# Patient Record
Sex: Male | Born: 1975 | Race: White | Hispanic: No | Smoking: Never smoker
Health system: Southern US, Community
[De-identification: ages and names within clinical notes are randomized; demographics above are authoritative.]

## PROBLEM LIST (undated history)

## (undated) DIAGNOSIS — N200 Calculus of kidney: Secondary | ICD-10-CM

## (undated) HISTORY — PX: TONSILLECTOMY: SUR1361

## (undated) HISTORY — PX: KNEE ARTHROSCOPY: SUR90

## (undated) HISTORY — PX: SHOULDER ARTHROSCOPY W/ ROTATOR CUFF REPAIR: SHX2400

## (undated) HISTORY — DX: Calculus of kidney: N20.0

## (undated) HISTORY — PX: HERNIA REPAIR: SHX51

---

## 2008-02-06 ENCOUNTER — Emergency Department (HOSPITAL_COMMUNITY): Admission: EM | Admit: 2008-02-06 | Discharge: 2008-02-07 | Payer: Self-pay | Admitting: Emergency Medicine

## 2009-05-08 ENCOUNTER — Ambulatory Visit (HOSPITAL_COMMUNITY): Admission: RE | Admit: 2009-05-08 | Discharge: 2009-05-08 | Payer: Self-pay | Admitting: Orthopedic Surgery

## 2010-03-04 IMAGING — CR DG HAND COMPLETE 3+V*R*
3 series · 3 of 3 positions shown · non-contrast
Comparison: None

CLINICAL DATA: Dog bite to right hand

RIGHT HAND - COMPLETE 3+ VIEW

[x hand pa right]
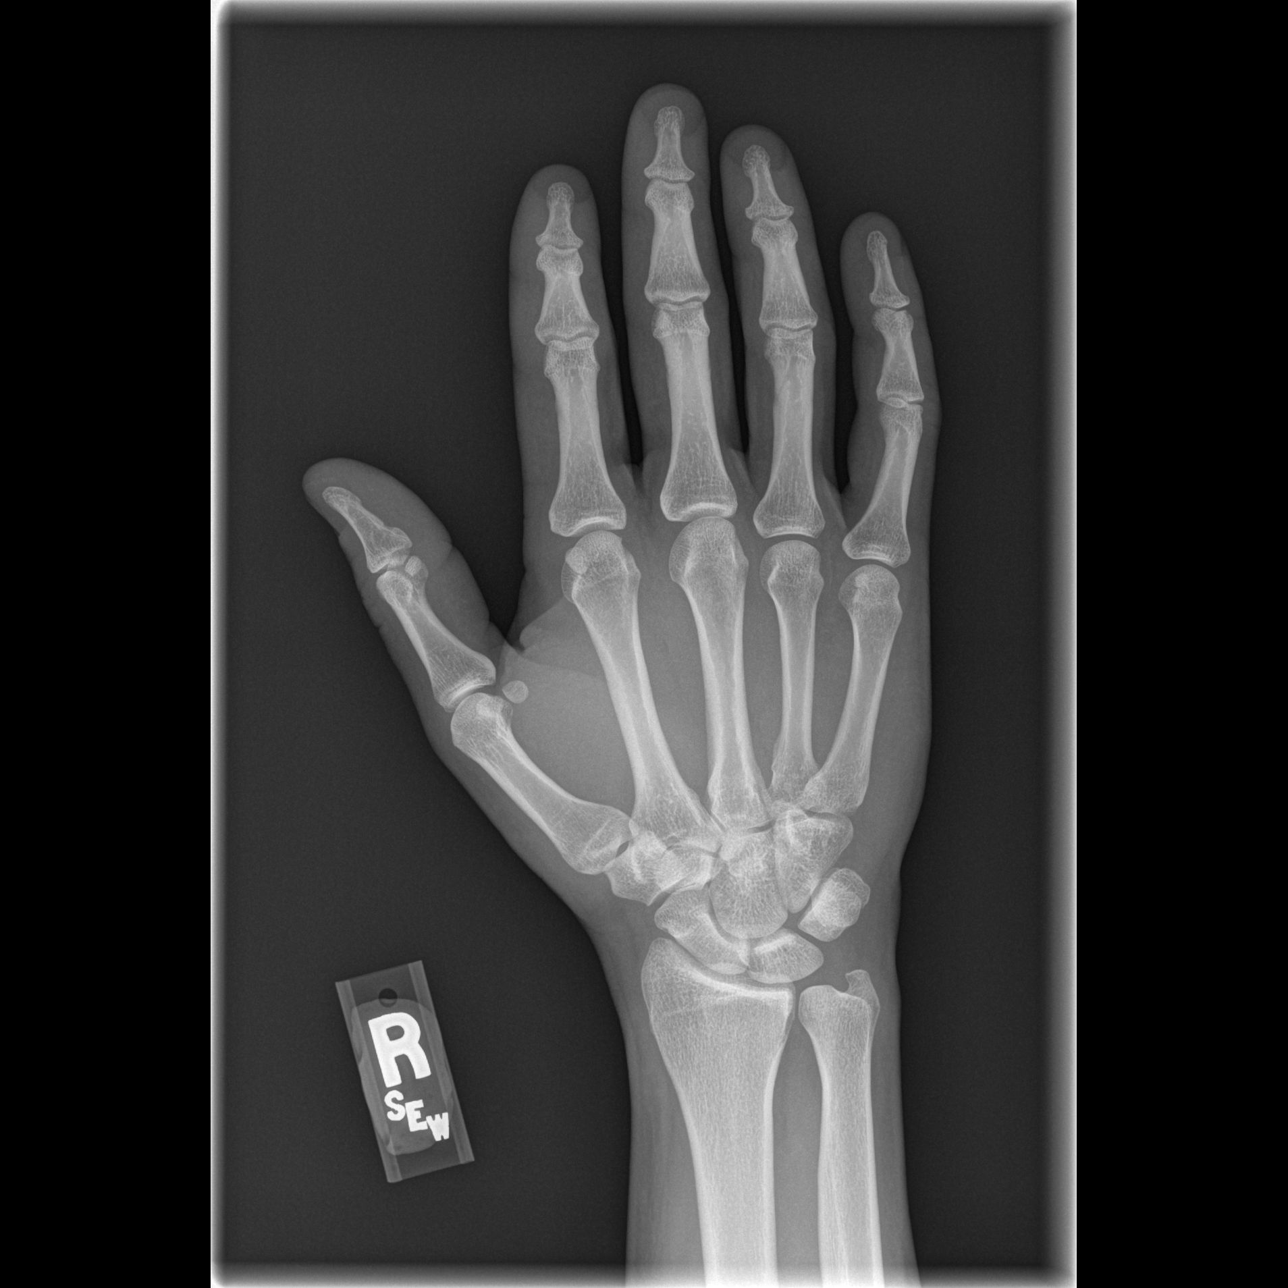

[x hand oblique right]
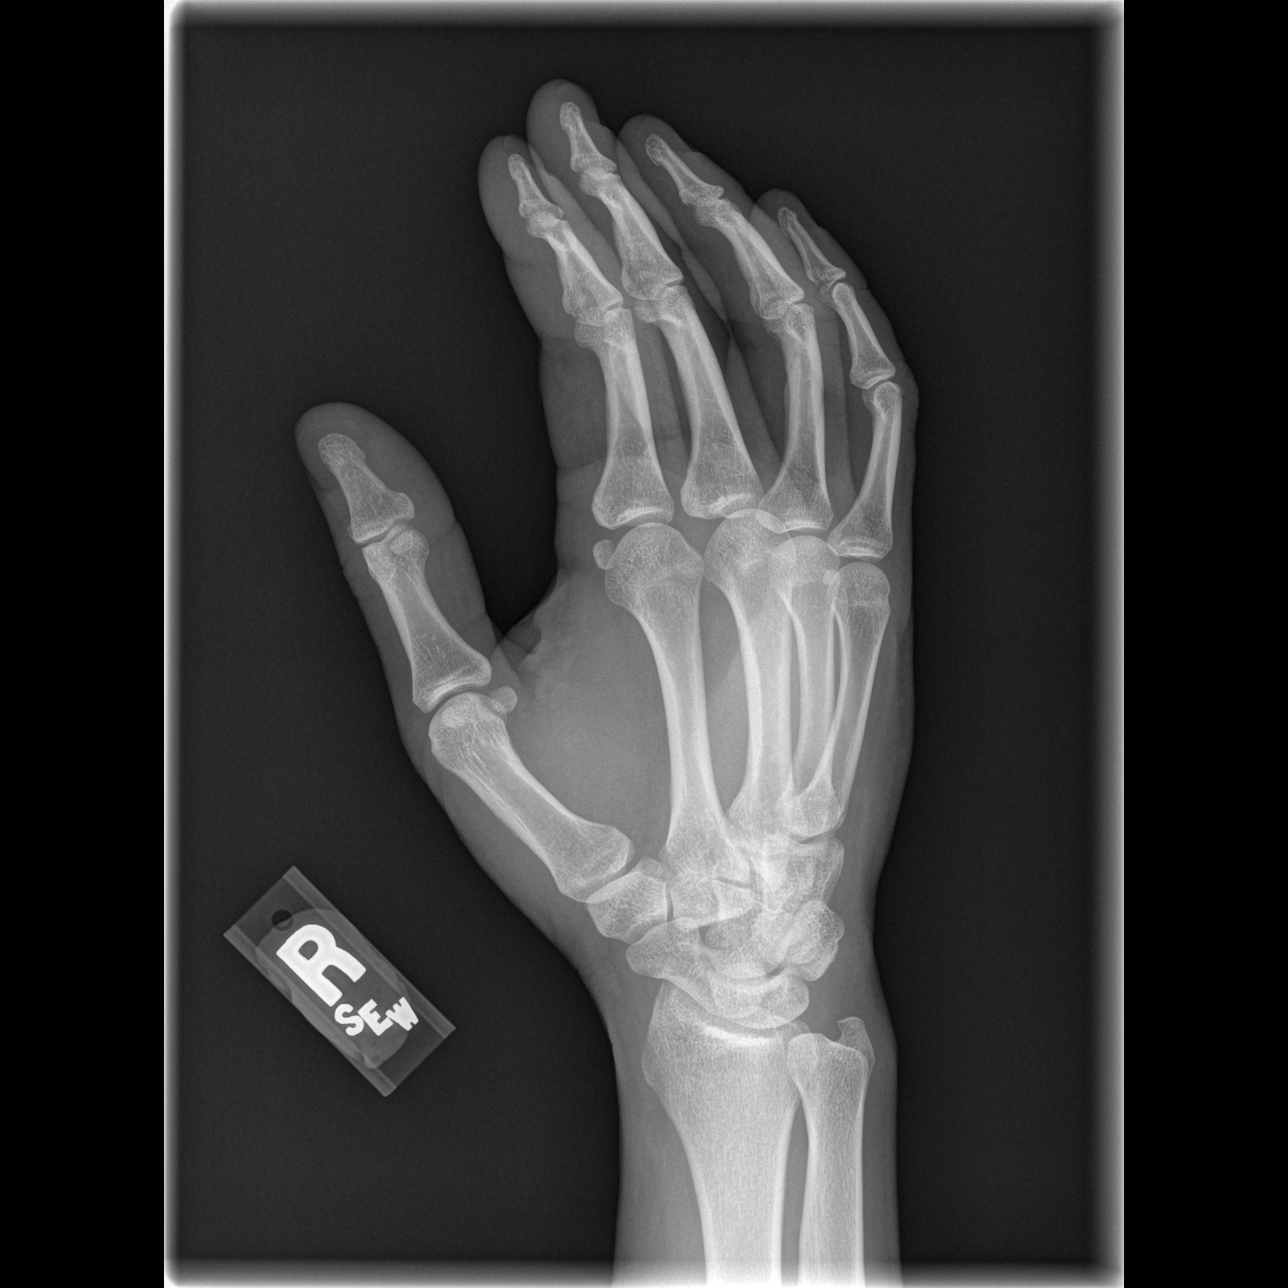

[x hand lat right]
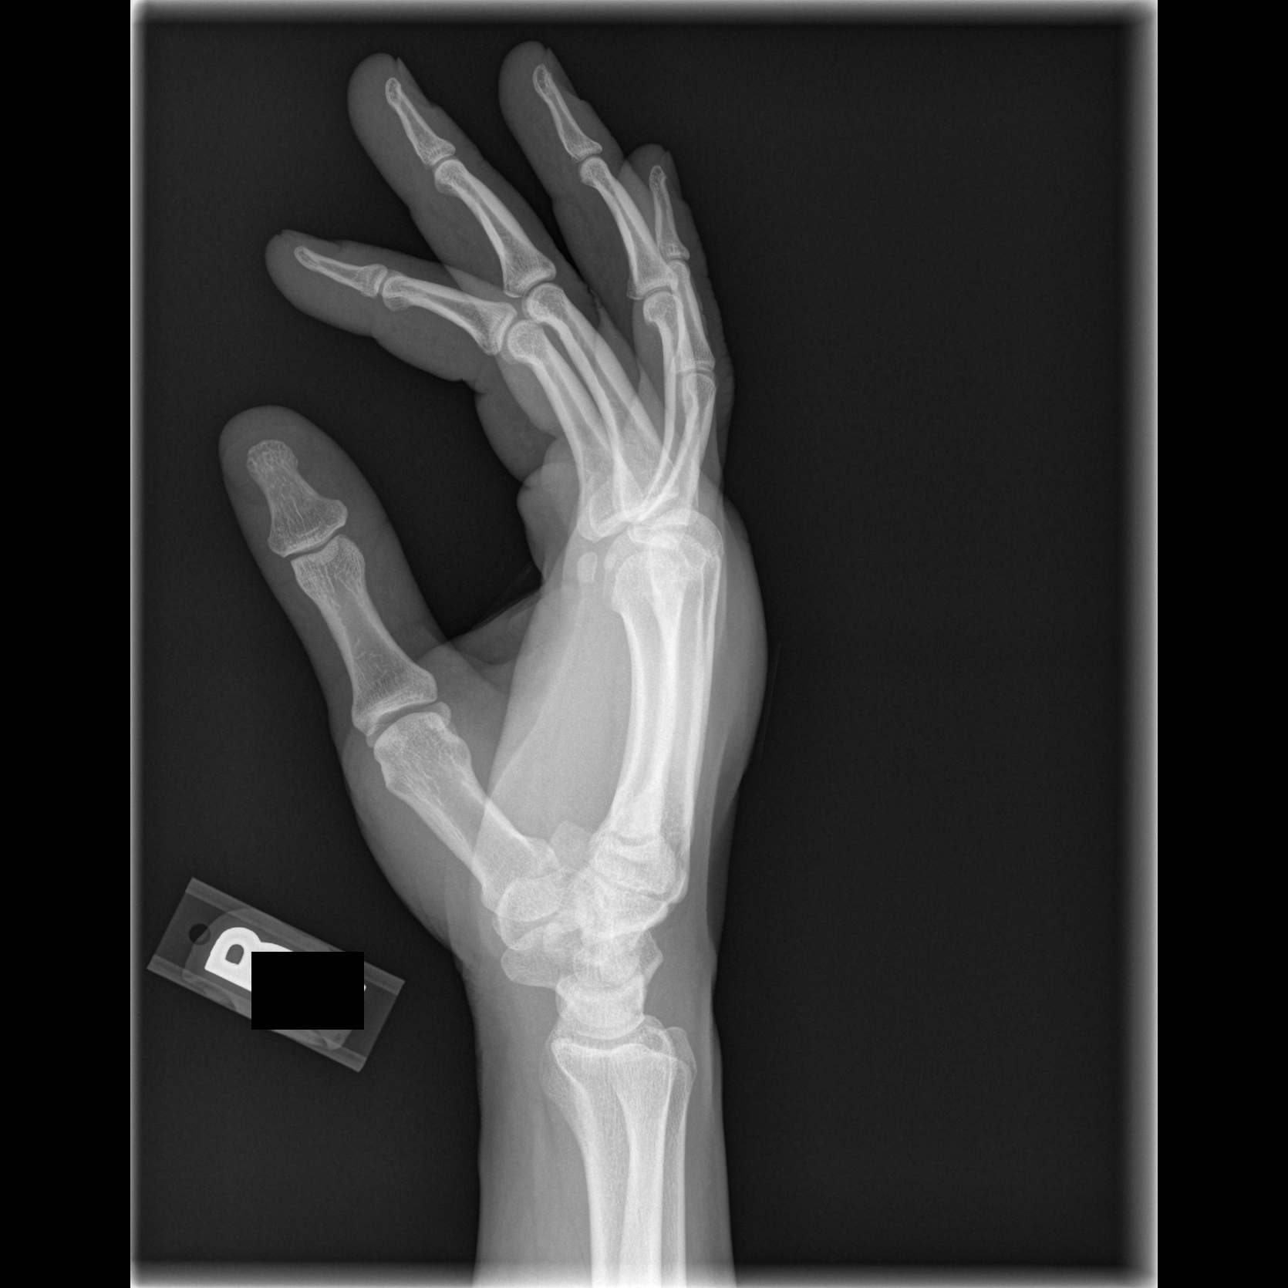

[3 of 3 positions shown; findings below may reference images not displayed]

FINDINGS: Dorsal soft tissue swelling is present.
No evidence of fracture, subluxation, or dislocation identified.
No evidence of radiopaque body noted.
IMPRESSION: Soft tissue swelling without evidence of acute bony abnormality or
radiopaque foreign body.

## 2015-11-28 DIAGNOSIS — I10 Essential (primary) hypertension: Secondary | ICD-10-CM | POA: Insufficient documentation

## 2016-12-18 DIAGNOSIS — Z86010 Personal history of colonic polyps: Secondary | ICD-10-CM | POA: Insufficient documentation

## 2018-04-07 DIAGNOSIS — M2550 Pain in unspecified joint: Secondary | ICD-10-CM | POA: Insufficient documentation

## 2018-04-09 DIAGNOSIS — R768 Other specified abnormal immunological findings in serum: Secondary | ICD-10-CM | POA: Insufficient documentation

## 2018-04-09 DIAGNOSIS — R7689 Other specified abnormal immunological findings in serum: Secondary | ICD-10-CM | POA: Insufficient documentation

## 2019-03-22 DIAGNOSIS — N200 Calculus of kidney: Secondary | ICD-10-CM | POA: Diagnosis not present

## 2019-03-23 DIAGNOSIS — N3000 Acute cystitis without hematuria: Secondary | ICD-10-CM | POA: Diagnosis not present

## 2019-03-23 DIAGNOSIS — M545 Low back pain: Secondary | ICD-10-CM | POA: Diagnosis not present

## 2020-04-15 DIAGNOSIS — Z20828 Contact with and (suspected) exposure to other viral communicable diseases: Secondary | ICD-10-CM | POA: Diagnosis not present

## 2020-04-15 DIAGNOSIS — R509 Fever, unspecified: Secondary | ICD-10-CM | POA: Diagnosis not present

## 2020-04-23 DIAGNOSIS — J189 Pneumonia, unspecified organism: Secondary | ICD-10-CM | POA: Diagnosis not present

## 2020-04-23 DIAGNOSIS — R0602 Shortness of breath: Secondary | ICD-10-CM | POA: Diagnosis not present

## 2020-12-16 ENCOUNTER — Other Ambulatory Visit: Payer: Self-pay

## 2020-12-16 ENCOUNTER — Encounter: Payer: Self-pay | Admitting: Nurse Practitioner

## 2020-12-16 ENCOUNTER — Ambulatory Visit: Payer: BC Managed Care – PPO | Admitting: Nurse Practitioner

## 2020-12-16 VITALS — BP 116/80 | HR 60 | Temp 97.9°F | Resp 14 | Ht 66.0 in | Wt 182.6 lb

## 2020-12-16 DIAGNOSIS — Z86010 Personal history of colonic polyps: Secondary | ICD-10-CM | POA: Diagnosis not present

## 2020-12-16 DIAGNOSIS — G8929 Other chronic pain: Secondary | ICD-10-CM | POA: Diagnosis not present

## 2020-12-16 DIAGNOSIS — R519 Headache, unspecified: Secondary | ICD-10-CM

## 2020-12-16 DIAGNOSIS — Z7689 Persons encountering health services in other specified circumstances: Secondary | ICD-10-CM | POA: Diagnosis not present

## 2020-12-16 DIAGNOSIS — Z87442 Personal history of urinary calculi: Secondary | ICD-10-CM

## 2020-12-16 NOTE — Progress Notes (Signed)
New Patient Office Visit  Subjective:  Patient ID: Brett Norman, male    DOB: 1976/05/20  Age: 46 y.o. MRN: [id number]  CC:  Chief Complaint  Patient presents with  . Tinnitus  . pain at base of skull    HPI Brett Norman is a 45 year old Caucasian male that presents to establish care with a primary care provider, evaluation of bilateral tinnitus and intermittent pain to right base of skull with dizziness. Describes pain as aching/throbbing in nature 7/10 on 0/10 scale. States area is tender to touch.  Denies swelling to the area, fever, or nausea. He denies previous trauma to base of skull. Onset of symptoms 2-weeks ago. Treatment has included Advil OTC and heating pad PRN.  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 States he is around loud machines for several hours a day, wears ear plugs. 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 Shoulder surgery x 3, knee surgery x 2.   States he did suffer a concussion in middle school during a basketball game in the 8th grade. He tells me he had migraines during his 8th grade school year bu they subsided soon after.   Brett Norman tells me that he has undergone two screening colonoscopies due to rectal bleeding. Colon polyps were found on initial exam, none on the follow-up exam 3-years later. States he is due for 5-year follow-up colonoscopy.   Brett Norman has a history of renal stones. He is not currently bothered by symptoms. States he does experience intermittent flank pain.   Past Medical History:  Diagnosis Date  . Kidney stone     Past Surgical History:  Procedure Laterality Date  . HERNIA REPAIR     age 35  . KNEE ARTHROSCOPY     1996, 1998   . SHOULDER ARTHROSCOPY W/ ROTATOR CUFF REPAIR Left   .  TONSILLECTOMY     age 44     Family History  Problem Relation Age of Onset  . Breast cancer Mother   . Osteoarthritis Father     Social History   Socioeconomic History  . Marital status: Married    Spouse name: Not on file  . Number of children: Not on file  . Years of education: Not on file  . Highest education level: Not on file  Occupational History  . Not on file  Tobacco Use  . Smoking status: Never Smoker  . Smokeless tobacco: Never Used  Substance and Sexual Activity  . Alcohol use: Never  . Drug use: Never  . Sexual activity: Yes  Other Topics Concern  . Not on file  Social History Narrative  . Not on file   Social Determinants of Health   Financial Resource Strain: Not on file  Food Insecurity: Not on file  Transportation Needs: Not on file  Physical Activity: Not on file  Stress: Not on file  Social Connections: Not on file  Intimate Partner Violence: Not on file    ROS Review of Systems  Constitutional: Negative for appetite change, fatigue and unexpected weight change.  HENT: Positive for tinnitus (bilateral). Negative for congestion, ear pain, rhinorrhea, sinus pressure and sinus pain.   Eyes: Negative for pain.  Respiratory: Negative for cough and shortness of breath.   Cardiovascular:  Negative for chest pain, palpitations and leg swelling.  Gastrointestinal: Negative for abdominal pain, constipation, diarrhea, nausea and vomiting.  Endocrine: Negative for cold intolerance, heat intolerance, polydipsia, polyphagia and polyuria.  Genitourinary: Positive for flank pain (intermittent). Negative for dysuria, frequency and hematuria.  Musculoskeletal: Positive for arthralgias (bilateral shoulders/knees) and back pain (chronic). Negative for joint swelling and myalgias.  Skin: Negative for rash.  Allergic/Immunologic: Negative for environmental allergies.  Neurological: Positive for dizziness and headaches.  Hematological: Negative for adenopathy.   Psychiatric/Behavioral: Negative for decreased concentration and sleep disturbance. The patient is not nervous/anxious.     Objective:   Today's Vitals: BP 116/80   Pulse 60   Temp 97.9 F (36.6 C)   Resp 14   Ht 5\' 6"  (1.676 m)   Wt 182 lb 9.6 oz (82.8 kg)   BMI 29.47 kg/m   Physical Exam Vitals reviewed.  Constitutional:      Appearance: Normal appearance.  HENT:     Head: Normocephalic.     Right Ear: Tympanic membrane normal.     Left Ear: Tympanic membrane normal.     Nose: Nose normal.     Mouth/Throat:     Mouth: Mucous membranes are moist.  Cardiovascular:     Rate and Rhythm: Normal rate and regular rhythm.     Pulses: Normal pulses.     Heart sounds: Normal heart sounds.  Pulmonary:     Effort: Pulmonary effort is normal.     Breath sounds: Normal breath sounds.  Abdominal:     General: Bowel sounds are normal.     Palpations: Abdomen is soft.  Musculoskeletal:        General: Normal range of motion.  Skin:    General: Skin is warm and dry.     Capillary Refill: Capillary refill takes less than 2 seconds.  Neurological:     General: No focal deficit present.     Mental Status: He is alert and oriented to person, place, and time.  Psychiatric:        Mood and Affect: Mood normal.        Behavior: Behavior normal.     Assessment & Plan:   1. Chronic nonintractable headache, unspecified headache type - CBC with Differential/Platelet - Comprehensive metabolic panel - TSH - DG Skull Complete; Future  2. History of colon polyps - Ambulatory referral to Gastroenterology  3. History of kidney stones - Uric acid  4. Encounter to establish care with new doctor    We will call you with lab results and appointment for GI for screening colonoscopy Recommend eye exam Obtain x-ray of skull at [facility] 12/17/20  Follow-up 2 weeks    Follow-up: 2-weeks   Signed, 02/16/21, NP

## 2020-12-16 NOTE — Patient Instructions (Signed)
We will call you with lab results and appointment for GI for screening colonoscopy Recommend eye exam Obtain x-ray of skull at [facility] 12/17/20  Follow-up 2 weeks  General Headache Without Cause A headache is pain or discomfort that is felt around the head or neck area. There are many causes and types of headaches. In some cases, the cause may not be found. Follow these instructions at home: Watch your condition for any changes. Let your doctor know about them. Take these steps to help with your condition: Managing pain  Take over-the-counter and prescription medicines only as told by your doctor.  Lie down in a dark, quiet room when you have a headache.  If told, put ice on your head and neck area: ? Put ice in a plastic bag. ? Place a towel between your skin and the bag. ? Leave the ice on for 20 minutes, 2-3 times per day.  If told, put heat on the affected area. Use the heat source that your doctor recommends, such as a moist heat pack or a heating pad. ? Place a towel between your skin and the heat source. ? Leave the heat on for 20-30 minutes. ? Remove the heat if your skin turns bright red. This is very important if you are unable to feel pain, heat, or cold. You may have a greater risk of getting burned.  Keep lights dim if bright lights bother you or make your headaches worse.      Eating and drinking  Eat meals on a regular schedule.  If you drink alcohol: ? Limit how much you use to:  0-1 drink a day for women.  0-2 drinks a day for men. ? Be aware of how much alcohol is in your drink. In the [place] one drink equals one 12 oz bottle of beer (355 mL), one 5 oz glass of wine (148 mL), or one 1 oz glass of hard liquor (44 mL).  Stop drinking caffeine, or reduce how much caffeine you drink. General instructions  Keep a journal to find out if certain things bring on headaches. For example, write down: ? What you eat and drink. ? How much sleep you  get. ? Any change to your diet or medicines.  Get a massage or try other ways to relax.  Limit stress.  Sit up straight. Do not tighten (tense) your muscles.  Do not use any products that contain nicotine or tobacco. This includes cigarettes, e-cigarettes, and chewing tobacco. If you need help quitting, ask your doctor.  Exercise regularly as told by your doctor.  Get enough sleep. This often means 7-9 hours of sleep each night.  Keep all follow-up visits as told by your doctor. This is important.   Contact a doctor if:  Your symptoms are not helped by medicine.  You have a headache that feels different than the other headaches.  You feel sick to your stomach (nauseous) or you throw up (vomit).  You have a fever. Get help right away if:  Your headache gets very bad quickly.  Your headache gets worse after a lot of physical activity.  You keep throwing up.  You have a stiff neck.  You have trouble seeing.  You have trouble speaking.  You have pain in the eye or ear.  Your muscles are weak or you lose muscle control.  You lose your balance or have trouble walking.  You feel like you will pass out (faint) or you pass out.  You  are mixed up (confused).  You have a seizure. Summary  A headache is pain or discomfort that is felt around the head or neck area.  There are many causes and types of headaches. In some cases, the cause may not be found.  Keep a journal to help find out what causes your headaches. Watch your condition for any changes. Let your doctor know about them.  Contact a doctor if you have a headache that is different from usual, or if your headache is not helped by medicine.  Get help right away if your headache gets very bad, you throw up, you have trouble seeing, you lose your balance, or you have a seizure. This information is not intended to replace advice given to you by your health care provider. Make sure you discuss any questions you  have with your health care provider. Document Revised: 03/14/2018 Document Reviewed: 03/14/2018 Elsevier Patient Education  2021 [employer].

## 2020-12-17 ENCOUNTER — Encounter: Payer: Self-pay | Admitting: Nurse Practitioner

## 2020-12-17 DIAGNOSIS — R519 Headache, unspecified: Secondary | ICD-10-CM | POA: Diagnosis not present

## 2020-12-17 DIAGNOSIS — G8929 Other chronic pain: Secondary | ICD-10-CM | POA: Diagnosis not present

## 2020-12-17 DIAGNOSIS — R42 Dizziness and giddiness: Secondary | ICD-10-CM | POA: Diagnosis not present

## 2020-12-17 LAB — COMPREHENSIVE METABOLIC PANEL
ALT: 32 IU/L (ref 0–44)
AST: 22 IU/L (ref 0–40)
Albumin/Globulin Ratio: 2 (ref 1.2–2.2)
Albumin: 4.7 g/dL (ref 4.0–5.0)
Alkaline Phosphatase: 122 IU/L — ABNORMAL HIGH (ref 44–121)
BUN/Creatinine Ratio: 13 (ref 9–20)
BUN: 15 mg/dL (ref 6–24)
Bilirubin Total: 0.5 mg/dL (ref 0.0–1.2)
CO2: 21 mmol/L (ref 20–29)
Calcium: 9.4 mg/dL (ref 8.7–10.2)
Chloride: 102 mmol/L (ref 96–106)
Creatinine, Ser: 1.2 mg/dL (ref 0.76–1.27)
Globulin, Total: 2.3 g/dL (ref 1.5–4.5)
Glucose: 112 mg/dL — ABNORMAL HIGH (ref 65–99)
Potassium: 4.2 mmol/L (ref 3.5–5.2)
Sodium: 143 mmol/L (ref 134–144)
Total Protein: 7 g/dL (ref 6.0–8.5)
eGFR: 77 mL/min/{1.73_m2} (ref >59)

## 2020-12-17 LAB — CBC WITH DIFFERENTIAL/PLATELET
Basophils Absolute: 0 10*3/uL (ref 0.0–0.2)
Basos: 0 %
EOS (ABSOLUTE): 0.1 10*3/uL (ref 0.0–0.4)
Eos: 1 %
Hematocrit: 50.5 % (ref 37.5–51.0)
Hemoglobin: 17.2 g/dL (ref 13.0–17.7)
Immature Grans (Abs): 0 10*3/uL (ref 0.0–0.1)
Immature Granulocytes: 0 %
Lymphocytes Absolute: 2.5 10*3/uL (ref 0.7–3.1)
Lymphs: 32 %
MCH: 29.7 pg (ref 26.6–33.0)
MCHC: 34.1 g/dL (ref 31.5–35.7)
MCV: 87 fL (ref 79–97)
Monocytes Absolute: 0.4 10*3/uL (ref 0.1–0.9)
Monocytes: 5 %
Neutrophils Absolute: 4.7 10*3/uL (ref 1.4–7.0)
Neutrophils: 62 %
Platelets: 228 10*3/uL (ref 150–450)
RBC: 5.8 x10E6/uL (ref 4.14–5.80)
RDW: 12.4 % (ref 11.6–15.4)
WBC: 7.8 10*3/uL (ref 3.4–10.8)

## 2020-12-17 LAB — URIC ACID: Uric Acid: 6.4 mg/dL (ref 3.8–8.4)

## 2020-12-17 LAB — TSH: TSH: 1.5 u[IU]/mL (ref 0.450–4.500)

## 2020-12-20 ENCOUNTER — Other Ambulatory Visit: Payer: Self-pay | Admitting: Nurse Practitioner

## 2020-12-20 DIAGNOSIS — R519 Headache, unspecified: Secondary | ICD-10-CM

## 2020-12-20 DIAGNOSIS — G8929 Other chronic pain: Secondary | ICD-10-CM

## 2020-12-30 ENCOUNTER — Ambulatory Visit: Payer: BC Managed Care – PPO | Admitting: Nurse Practitioner

## 2021-03-12 ENCOUNTER — Encounter: Payer: Self-pay | Admitting: Neurology

## 2021-03-12 ENCOUNTER — Ambulatory Visit (INDEPENDENT_AMBULATORY_CARE_PROVIDER_SITE_OTHER): Payer: BC Managed Care – PPO | Admitting: Neurology

## 2021-03-12 VITALS — BP 129/78 | HR 93 | Ht 67.0 in | Wt 191.6 lb

## 2021-03-12 DIAGNOSIS — G4486 Cervicogenic headache: Secondary | ICD-10-CM

## 2021-03-12 DIAGNOSIS — Z820 Family history of epilepsy and other diseases of the nervous system: Secondary | ICD-10-CM

## 2021-03-12 DIAGNOSIS — M542 Cervicalgia: Secondary | ICD-10-CM

## 2021-03-12 DIAGNOSIS — H9313 Tinnitus, bilateral: Secondary | ICD-10-CM

## 2021-03-12 DIAGNOSIS — R519 Headache, unspecified: Secondary | ICD-10-CM

## 2021-03-12 DIAGNOSIS — R0683 Snoring: Secondary | ICD-10-CM | POA: Diagnosis not present

## 2021-03-12 MED ORDER — CYCLOBENZAPRINE HCL 10 MG PO TABS: 10.0000 mg | ORAL_TABLET | Freq: Every evening | ORAL | 2 refills | Status: DC | PRN

## 2021-03-12 NOTE — Patient Instructions (Addendum)
It was nice to meet you today.  Your headaches do not sound like classic migraines.  As discussed, we will do some additional testing.  I would like to proceed with a neck MRI with and without contrast to look for a structural cause of your headaches and neck pain. I would like to proceed with a sleep study to look into the possibility of obstructive sleep apnea.  You have a family history of sleep apnea and may be at risk for a due to your snoring and somewhat crowded appearing airway.  If you have obstructive sleep apnea, I will likely recommend treatment with a CPAP or similar machine. Please try to hydrate well with water 6 to 8 cups are recommended daily, 8 ounce size each.  Please limit your caffeine to up to 2 servings per day.  Please schedule a formal eye examination with any optometrist or ophthalmologist of your choosing.  Your neurological exam is normal thankfully.  We will keep you posted as to your MRI and sleep study results and plan to follow-up accordingly.  As discussed, we will try you on Flexeril which is a muscle relaxer.  This may help alleviate your recurrent headaches.  If you continue to have ringing in your ears, please talk to your primary care about a referral to ENT (ear, nose, and throat specialist) to get checked out for this.

## 2021-03-12 NOTE — Progress Notes (Signed)
Subjective:    Patient ID: Brett Norman is a 45 y.o. male.  HPI    Brett Foley, MD, PhD [facility] [address] [address] [place], [place] [address]  Dear Carollee Herter,   I saw your patient, Brett Norman, upon your kind request in my neurologic clinic today for initial consultation of his recurrent headaches.  The patient is unaccompanied today.  As you know, Brett Norman is a 45 year old right-handed gentleman with an underlying medical history of kidney stones, low back pain, colonic polyps, and overweight state, who reports a 3 to 56-month history of recurrent headaches affecting the back of his head, base of the skull area.  Around the same time he started having ringing in the ears bilaterally.  No hearing loss, no sudden onset of one-sided weakness or numbness or tingling or droopy face or slurring of speech.  When he first started having symptoms he had a brief bout of dizziness, describes it as a spinning sensation but it has not been recurrent.  His headache is not like his previous migraines.  His headache is constant and achy but intermittent as to frequency.  He does not have any associated nausea or vomiting but sometimes gets sensitive to light.  He has associated neck pain and sometimes the area between the shoulder blades hurts like he has a crick in the neck.  He has tried Flexeril in the past for back pain.  It helps.  He has not fallen.  He has not had any injuries.  His neck pain is localized, not typically radiating to either side.  Of note, he had a basketball injury in eighth grade and started having migraines but they improved.  He has tried over-the-counter ibuprofen or Excedrin Migraine with some relief.  He does not take any daily over-the-counter pain medication.  He works third shift.  He works at a [employer].  He works from 10 PM to 6:30 AM on Sundays through Thursdays.  Bedtime is generally between 730 and 8 AM, rise time between 2 and 3 PM.   He lives with his significant other and his 92 year old son.  He also has a 20 year old son who lives in [place] state and is a Psychologist, forensic and he has a 67 year old daughter.  He has 1 grandchild.  He is a non-smoker and drinks no alcohol, drinks caffeine in the form of soda, about 4 servings per day on average.  He does not always hydrate well with water by self admission.  Sometimes when he turns his neck side to side or backwards he feels a crunching, like his neck wants to pop.  He denies any visual symptoms such as blurry vision or double vision or loss of vision but has not had a formal eye examination in about 4 years.  He has to get his hearing checked yearly through work.  When he first started working at this plan some 4 years ago he was noted to have mild hearing loss but it has been stable since then.  He snores, he has never had a sleep study.  He has woken up with a headache.  He denies recurrent nocturia.  Mom has sleep apnea and has a CPAP machine.  I reviewed your office note from 12/16/2020.  He had blood work through your office at the time including CBC, CMP, TSH and uric acid, I reviewed his blood test results.  Labs were benign.  He had an x-ray of his skull on 12/17/2020 and  I reviewed the results: Impression: Negative.  His Past Medical History Is Significant For: Past Medical History:  Diagnosis Date   Kidney stone     His Past Surgical History Is Significant For: Past Surgical History:  Procedure Laterality Date   HERNIA REPAIR     age 67   KNEE ARTHROSCOPY     1996, 1998    SHOULDER ARTHROSCOPY W/ ROTATOR CUFF REPAIR Left    TONSILLECTOMY     age 59     His Family History Is Significant For: Family History  Problem Relation Age of Onset   Breast cancer Mother    Osteoarthritis Father     His Social History Is Significant For: Social History   Socioeconomic History   Marital status: Legally Separated    Spouse name: Not on file   Number of children: 3    Years of education: Not on file   Highest education level: High school graduate  Occupational History   Not on file  Tobacco Use   Smoking status: Never   Smokeless tobacco: Never  Vaping Use   Vaping Use: Never used  Substance and Sexual Activity   Alcohol use: Never   Drug use: Never   Sexual activity: Yes  Other Topics Concern   Not on file  Social History Narrative   Lives with son and his mother   Right handed   Caffeine: 2-3 sodas a day   Social Determinants of Health   Financial Resource Strain: Not on file  Food Insecurity: Not on file  Transportation Needs: Not on file  Physical Activity: Not on file  Stress: Not on file  Social Connections: Not on file    His Allergies Are:  Allergies  Allergen Reactions   Codeine Hives  :   His Current Medications Are:  No outpatient encounter medications on file as of 03/12/2021.   No facility-administered encounter medications on file as of 03/12/2021.  :  Review of Systems:  Out of a complete 14 point review of systems, all are reviewed and negative with the exception of these symptoms as listed below:  Review of Systems  Neurological:        Pt c/o of occipital HA w/ pressure, causing ringing in both ears. Pt states it hurts to the touch and turning head at times. Ongoing last 4-5 months. Pt take Excedrin migraines and ibuprofen, with some relief. 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    Objective:  Neurological Exam  Physical Exam Physical Examination:   Vitals:   03/12/21 0728  BP: 129/78  Pulse: 93    General Examination: The patient is a very pleasant 45 y.o. male in no acute distress. He appears well-developed and well-nourished and well groomed.   HEENT: Normocephalic, atraumatic, pupils are equal, round and reactive to light and accommodation. Funduscopic exam is normal with sharp disc margins noted. Extraocular tracking is good without limitation to gaze excursion or nystagmus noted.  Normal smooth pursuit is noted. Hearing is grossly intact. Tympanic membranes are clear bilaterally. Face is symmetric with normal facial animation and normal facial sensation. Speech is clear with no dysarthria noted. There is no hypophonia. There is no lip, neck/head, jaw or voice tremor. Neck is supple with full range of passive and active motion. There are no carotid bruits on auscultation. Oropharynx exam reveals: mild mouth dryness, adequate dental hygiene and mild airway crowding, due to small airway entry, Mallampati class II, tonsils absent.  Tongue protrudes  centrally and palate elevates symmetrically.  Neck circumference of 15-1/2 inches.  Chest: Clear to auscultation without wheezing, rhonchi or crackles noted.  Heart: S1+S2+0, regular and normal without murmurs, rubs or gallops noted.   Abdomen: Soft, non-tender and non-distended.  Extremities: There is no pitting edema in the distal lower extremities bilaterally. Pedal pulses are intact.  Skin: Warm and dry without trophic changes noted.  Musculoskeletal: exam reveals no obvious joint deformities, tenderness or joint swelling or erythema.   Neurologically:  Mental status: The patient is awake, alert and oriented in all 4 spheres. His immediate and remote memory, attention, language skills and fund of knowledge are appropriate. There is no evidence of aphasia, agnosia, apraxia or anomia. Speech is clear with normal prosody and enunciation. Thought process is linear. Mood is normal and affect is normal.  Cranial nerves II - XII are as described above under HEENT exam. In addition: shoulder shrug is normal with equal shoulder height noted. Motor exam: Normal bulk, strength and tone is noted. There is no drift, tremor or rebound. Romberg is negative. Reflexes are 2+ throughout. Babinski: Toes are flexor bilaterally. Fine motor skills and coordination: intact with normal finger taps, normal hand movements, normal rapid alternating patting,  normal foot taps and normal foot agility.  Cerebellar testing: No dysmetria or intention tremor on finger to nose testing. Heel to shin is unremarkable bilaterally. There is no truncal or gait ataxia.  Sensory exam: intact to light touch, vibration, temperature sense in the upper and lower extremities.  Gait, station and balance: He stands easily. No veering to one side is noted. No leaning to one side is noted. Posture is age-appropriate and stance is narrow based. Gait shows normal stride length and normal pace. No problems turning are noted. Tandem walk is unremarkable.                Assessment and Plan:  In summary, Brett Norman is a very pleasant 45 y.o.-year old male with an underlying medical history of kidney stones, colonic polyps, and overweight state, who presents for evaluation of his recurrent headaches that affect his upper neck and base of the head area.  Headache description is not classic for migraines.  He has started having tinnitus around the same time he started having headaches in 3 or 4 months ago.  Differential diagnosis includes cervicogenic headaches, tension headaches, less likely migraines or vestibular migraines. Exam is nonfocal and reassuring.  Nevertheless, to rule out a structural cause of his neck and head pain I would like to proceed with a neck MRI with and without contrast.  He is also advised regarding lifestyle modification that could help alleviate headaches.  To that end, he is advised to increase his water intake, limit his caffeine intake and also proceed with a home sleep test to look into the possibility of underlying obstructive sleep disordered breathing.  He has a family history of sleep apnea and also snores.  He has woken up with a headache and may be at risk for obstructive sleep apnea.  If he has OSA, he is advised to consider treatment with a CPAP or AutoPap machine.  He is familiar with the CPAP treatment as his mom has a machine.  For now for symptomatic  treatments I recommended a trial of as needed Flexeril.  He is furthermore advised to seek a formal eye evaluation with an optometrist or ophthalmologist of his choosing.  He is also advised to consider seeing an ENT for new onset tinnitus  bilaterally.  He is advised to talk to about a referral.  We will keep him posted as to his neck MRI and home sleep test results by phone call and plan a follow-up in this clinic accordingly.  I answered all his questions today and he was in agreement.    Thank you very much for allowing me to participate in the care of this nice patient. If I can be of any further assistance to you please do not hesitate to call me at [phone].  Sincerely,   Brett Foley, MD, PhD

## 2021-03-17 ENCOUNTER — Telehealth: Payer: Self-pay | Admitting: Neurology

## 2021-03-17 NOTE — Telephone Encounter (Signed)
I called patient to ask if he had a preferred location for MRI. Patient states it does not matter, he is fine with coming to [place]. I filled out [facility] PA form for MRI cervical spine w/wo contrast (00712) and faxed to [phone] with notes. Once decision is received I will call patient to schedule MRI at [facility].

## 2021-03-19 NOTE — Telephone Encounter (Signed)
Received fax from [facility] stating MRI auth should go through [employer]. I called [employer] @ [phone]. I spoke with Melissa to start case for MRI. She was able to approve the case, case #[id number]. PA #[id number] (03/19/21- 09/15/21).

## 2021-03-25 ENCOUNTER — Other Ambulatory Visit: Payer: Self-pay

## 2021-03-25 ENCOUNTER — Ambulatory Visit (INDEPENDENT_AMBULATORY_CARE_PROVIDER_SITE_OTHER): Payer: BC Managed Care – PPO

## 2021-03-25 DIAGNOSIS — H9313 Tinnitus, bilateral: Secondary | ICD-10-CM

## 2021-03-25 DIAGNOSIS — M542 Cervicalgia: Secondary | ICD-10-CM | POA: Diagnosis not present

## 2021-03-25 DIAGNOSIS — G4486 Cervicogenic headache: Secondary | ICD-10-CM | POA: Diagnosis not present

## 2021-03-25 DIAGNOSIS — R519 Headache, unspecified: Secondary | ICD-10-CM

## 2021-03-25 DIAGNOSIS — R0683 Snoring: Secondary | ICD-10-CM

## 2021-03-25 DIAGNOSIS — Z820 Family history of epilepsy and other diseases of the nervous system: Secondary | ICD-10-CM

## 2021-03-25 MED ORDER — GADOBENATE DIMEGLUMINE 529 MG/ML IV SOLN
15.0000 mL | Freq: Once | INTRAVENOUS | Status: AC | PRN
  Administered 2021-03-25: 15 mL via INTRAVENOUS

## 2021-03-27 NOTE — Progress Notes (Signed)
MRI of the cervical spine (with and without contrast) demonstrating: - At C4-5 posterior disc bulging which slightly contacts the ventral spinal cord; no spinal stenosis or foraminal narrowing.  Suanne Marker, MD  This finding is unlikely to be the cause of neck pain. Please follow up with Dr Frances Furbish after her return next week.

## 2021-03-31 ENCOUNTER — Telehealth: Payer: Self-pay | Admitting: Neurology

## 2021-03-31 NOTE — Telephone Encounter (Signed)
Spoke with patient and discussed results of MRI C-spine as noted below by Dr Frances Furbish. The patient verbalized understanding and his questions were answered. He also advised he had not heard yet about his sleep study. I let him a follow-up message would be sent to the sleep team and he verbalized appreciation.

## 2021-03-31 NOTE — Telephone Encounter (Signed)
Please advise patient that his cervical spine MRI did not show any obvious disc herniation.  If he has ongoing chronic neck pain, it may be worthwhile consulting with a spine specialist.  He can talk to his primary care nurse practitioner or physician about a referral.  I have also suggested we proceed with a sleep study.  Please inquire whether or not he has been contacted he has regarding scheduling his sleep study or home sleep test.

## 2021-04-21 ENCOUNTER — Encounter: Payer: Self-pay | Admitting: Nurse Practitioner

## 2021-04-21 ENCOUNTER — Other Ambulatory Visit: Payer: Self-pay

## 2021-04-21 ENCOUNTER — Ambulatory Visit: Payer: BC Managed Care – PPO | Admitting: Nurse Practitioner

## 2021-04-21 VITALS — BP 122/72 | HR 91 | Temp 97.8°F | Ht 67.0 in | Wt 191.0 lb

## 2021-04-21 DIAGNOSIS — R5383 Other fatigue: Secondary | ICD-10-CM

## 2021-04-21 DIAGNOSIS — Z6829 Body mass index (BMI) 29.0-29.9, adult: Secondary | ICD-10-CM | POA: Diagnosis not present

## 2021-04-21 DIAGNOSIS — E663 Overweight: Secondary | ICD-10-CM

## 2021-04-21 MED ORDER — PHENTERMINE HCL 37.5 MG PO CAPS: 37.5000 mg | ORAL_CAPSULE | ORAL | 0 refills | Status: DC

## 2021-04-21 NOTE — Patient Instructions (Signed)
Begin Phentermine 37.5 mg (take 1/2 tablet the first few days) Increase physical activity Consume heart healthy diet Increase water intake We will call you with lab results Follow-up in 6-weeks for weight check Notify office for any adverse side effects of medication  Exercising to Lose Weight Exercise is structured, repetitive physical activity to improve fitness and health. Getting regular exercise is important for everyone. It is especially important if you are overweight. Being overweight increases your risk of heart disease, stroke, diabetes, high blood pressure, and several types of cancer.Reducing your calorie intake and exercising can help you lose weight. Exercise is usually categorized as moderate or vigorous intensity. To lose weight, most people need to do a certain amount of moderate-intensity orvigorous-intensity exercise each week. Moderate-intensity exercise  Moderate-intensity exercise is any activity that gets you moving enough to burn at least three times more energy (calories) than if you were sitting. Examples of moderate exercise include: Walking a mile in 15 minutes. Doing light yard work. Biking at an easy pace. Most people should get at least 150 minutes (2 hours and 30 minutes) a week ofmoderate-intensity exercise to maintain their body weight. Vigorous-intensity exercise Vigorous-intensity exercise is any activity that gets you moving enough to burn at least six times more calories than if you were sitting. When you exercise at this intensity, you should be working hard enough that you are not able tocarry on a conversation. Examples of vigorous exercise include: Running. Playing a team sport, such as football, basketball, and soccer. Jumping rope. Most people should get at least 75 minutes (1 hour and 15 minutes) a week ofvigorous-intensity exercise to maintain their body weight. How can exercise affect me? When you exercise enough to burn more calories than you  eat, you lose weight. Exercise also reduces body fat and builds muscle. The more muscle you have, the more calories you burn. Exercise also: Improves mood. Reduces stress and tension. Improves your overall fitness, flexibility, and endurance. Increases bone strength. The amount of exercise you need to lose weight depends on: Your age. The type of exercise. Any health conditions you have. Your overall physical ability. Talk to your health care provider about how much exercise you need and whattypes of activities are safe for you. What actions can I take to lose weight? Nutrition  Make changes to your diet as told by your health care provider or diet and nutrition specialist (dietitian). This may include: Eating fewer calories. Eating more protein. Eating less unhealthy fats. Eating a diet that includes fresh fruits and vegetables, whole grains, low-fat dairy products, and lean protein. Avoiding foods with added fat, salt, and sugar. Drink plenty of water while you exercise to prevent dehydration or heat stroke.  Activity Choose an activity that you enjoy and set realistic goals. Your health care provider can help you make an exercise plan that works for you. Exercise at a moderate or vigorous intensity most days of the week. The intensity of exercise may vary from person to person. You can tell how intense a workout is for you by paying attention to your breathing and heartbeat. Most people will notice their breathing and heartbeat get faster with more intense exercise. Do resistance training twice each week, such as: Push-ups. Sit-ups. Lifting weights. Using resistance bands. Getting short amounts of exercise can be just as helpful as long structured periods of exercise. If you have trouble finding time to exercise, try to include exercise in your daily routine. Get up, stretch, and walk around every 30  minutes throughout the day. Go for a walk during your lunch break. Park your car  farther away from your destination. If you take public transportation, get off one stop early and walk the rest of the way. Make phone calls while standing up and walking around. Take the stairs instead of elevators or escalators. Wear comfortable clothes and shoes with good support. Do not exercise so much that you hurt yourself, feel dizzy, or get very short of breath. Where to find more information U.[place]. Department of Health and Human Services: ThisPath.fi Centers for Disease Control and Prevention (CDC): FootballExhibition.com.br Contact a health care provider: Before starting a new exercise program. If you have questions or concerns about your weight. If you have a medical problem that keeps you from exercising. Get help right away if you have any of the following while exercising: Injury. Dizziness. Difficulty breathing or shortness of breath that does not go away when you stop exercising. Chest pain. Rapid heartbeat. Summary Being overweight increases your risk of heart disease, stroke, diabetes, high blood pressure, and several types of cancer. Losing weight happens when you burn more calories than you eat. Reducing the amount of calories you eat in addition to getting regular moderate or vigorous exercise each week helps you lose weight. This information is not intended to replace advice given to you by your health care provider. Make sure you discuss any questions you have with your healthcare provider. Document Revised: 12/04/2019 Document Reviewed: 12/21/2019 Elsevier Patient Education  2022 Elsevier Inc.   Calorie Counting for [employer] Calories are units of energy. Your body needs a certain number of calories from food to keep going throughout the day. When you eat or drink more calories than your body needs, your body stores the extra calories mostly as fat. When you eat or drink fewer calories than your body needs, your body burns fat to getthe energy it needs. Calorie counting  means keeping track of how many calories you eat and drink each day. Calorie counting can be helpful if you need to lose weight. If you eat fewer calories than your body needs, you should lose weight. Ask yourhealth care provider what a healthy weight is for you. For calorie counting to work, you will need to eat the right number of calories each day to lose a healthy amount of weight per week. A dietitian can help you figure out how many calories you need in a day and will suggest ways to reach your calorie goal. A healthy amount of weight to lose each week is usually 1-2 lb (0.5-0.9 kg). This usually means that your daily calorie intake should be reduced by 500-750 calories. Eating 1,200-1,500 calories a day can help most women lose weight. Eating 1,500-1,800 calories a day can help most men lose weight. What do I need to know about calorie counting? Work with your health care provider or dietitian to determine how many calories you should get each day. To meet your daily calorie goal, you will need to: Find out how many calories are in each food that you would like to eat. Try to do this before you eat. Decide how much of the food you plan to eat. Keep a food log. Do this by writing down what you ate and how many calories it had. To successfully lose weight, it is important to balance calorie counting with ahealthy lifestyle that includes regular activity. Where do I find calorie information?  The number of calories in a food can be  found on a Nutrition Facts label. If a food does not have a Nutrition Facts label, try to look up the calories onlineor ask your dietitian for help. Remember that calories are listed per serving. If you choose to have more than one serving of a food, you will have to multiply the calories per serving by the number of servings you plan to eat. For example, the label on a package of bread might say that a serving size is 1 slice and that there are 90 calories in a serving.  If you eat 1 slice, you will have eaten 90 calories. If you eat 2slices, you will have eaten 180 calories. How do I keep a food log? After each time that you eat, record the following in your food log as soon as possible: What you ate. Be sure to include toppings, sauces, and other extras on the food. How much you ate. This can be measured in cups, ounces, or number of items. How many calories were in each food and drink. The total number of calories in the food you ate. Keep your food log near you, such as in a pocket-sized notebook or on an app or website on your mobile phone. Some programs will calculate calories for you andshow you how many calories you have left to meet your daily goal. What are some portion-control tips? Know how many calories are in a serving. This will help you know how many servings you can have of a certain food. Use a measuring cup to measure serving sizes. You could also try weighing out portions on a kitchen scale. With time, you will be able to estimate serving sizes for some foods. Take time to put servings of different foods on your favorite plates or in your favorite bowls and cups so you know what a serving looks like. Try not to eat straight from a food's packaging, such as from a bag or box. Eating straight from the package makes it hard to see how much you are eating and can lead to overeating. Put the amount you would like to eat in a cup or on a plate to make sure you are eating the right portion. Use smaller plates, glasses, and bowls for smaller portions and to prevent overeating. Try not to multitask. For example, avoid watching TV or using your computer while eating. If it is time to eat, sit down at a table and enjoy your food. This will help you recognize when you are full. It will also help you be more mindful of what and how much you are eating. What are tips for following this plan? Reading food labels Check the calorie count compared with the serving  size. The serving size may be smaller than what you are used to eating. Check the source of the calories. Try to choose foods that are high in protein, fiber, and vitamins, and low in saturated fat, trans fat, and sodium. Shopping Read nutrition labels while you shop. This will help you make healthy decisions about which foods to buy. Pay attention to nutrition labels for low-fat or fat-free foods. These foods sometimes have the same number of calories or more calories than the full-fat versions. They also often have added sugar, starch, or salt to make up for flavor that was removed with the fat. Make a grocery list of lower-calorie foods and stick to it. Cooking Try to cook your favorite foods in a healthier way. For example, try baking instead of frying. Use low-fat  dairy products. Meal planning Use more fruits and vegetables. One-half of your plate should be fruits and vegetables. Include lean proteins, such as chicken, [place], and fish. Lifestyle Each week, aim to do one of the following: 150 minutes of moderate exercise, such as walking. 75 minutes of vigorous exercise, such as running. General information Know how many calories are in the foods you eat most often. This will help you calculate calorie counts faster. Find a way of tracking calories that works for you. Get creative. Try different apps or programs if writing down calories does not work for you. What foods should I eat?  Eat nutritious foods. It is better to have a nutritious, high-calorie food, such as an avocado, than a food with few nutrients, such as a bag of potato chips. Use your calories on foods and drinks that will fill you up and will not leave you hungry soon after eating. Examples of foods that fill you up are nuts and nut butters, vegetables, lean proteins, and high-fiber foods such as whole grains. High-fiber foods are foods with more than 5 g of fiber per serving. Pay attention to calories in drinks.  Low-calorie drinks include water and unsweetened drinks. The items listed above may not be a complete list of foods and beverages you can eat. Contact a dietitian for more information. What foods should I limit? Limit foods or drinks that are not good sources of vitamins, minerals, or protein or that are high in unhealthy fats. These include: Candy. Other sweets. Sodas, specialty coffee drinks, alcohol, and juice. The items listed above may not be a complete list of foods and beverages you should avoid. Contact a dietitian for more information. How do I count calories when eating out? Pay attention to portions. Often, portions are much larger when eating out. Try these tips to keep portions smaller: Consider sharing a meal instead of getting your own. If you get your own meal, eat only half of it. Before you start eating, ask for a container and put half of your meal into it. When available, consider ordering smaller portions from the menu instead of full portions. Pay attention to your food and drink choices. Knowing the way food is cooked and what is included with the meal can help you eat fewer calories. If calories are listed on the menu, choose the lower-calorie options. Choose dishes that include vegetables, fruits, whole grains, low-fat dairy products, and lean proteins. Choose items that are boiled, broiled, grilled, or steamed. Avoid items that are buttered, battered, fried, or served with cream sauce. Items labeled as crispy are usually fried, unless stated otherwise. Choose water, low-fat milk, unsweetened iced tea, or other drinks without added sugar. If you want an alcoholic beverage, choose a lower-calorie option, such as a glass of wine or light beer. Ask for dressings, sauces, and syrups on the side. These are usually high in calories, so you should limit the amount you eat. If you want a salad, choose a garden salad and ask for grilled meats. Avoid extra toppings such as bacon,  cheese, or fried items. Ask for the dressing on the side, or ask for olive oil and vinegar or lemon to use as dressing. Estimate how many servings of a food you are given. Knowing serving sizes will help you be aware of how much food you are eating at restaurants. Where to find more information Centers for Disease Control and Prevention: FootballExhibition.com.br U.S. Department of Agriculture: WrestlingReporter.dk Summary Calorie counting means keeping track of  how many calories you eat and drink each day. If you eat fewer calories than your body needs, you should lose weight. A healthy amount of weight to lose per week is usually 1-2 lb (0.5-0.9 kg). This usually means reducing your daily calorie intake by 500-750 calories. The number of calories in a food can be found on a Nutrition Facts label. If a food does not have a Nutrition Facts label, try to look up the calories online or ask your dietitian for help. Use smaller plates, glasses, and bowls for smaller portions and to prevent overeating. Use your calories on foods and drinks that will fill you up and not leave you hungry shortly after a meal. This information is not intended to replace advice given to you by your health care provider. Make sure you discuss any questions you have with your healthcare provider. Document Revised: 10/05/2019 Document Reviewed: 10/05/2019 Elsevier Patient Education  2022 [employer].

## 2021-04-21 NOTE — Progress Notes (Signed)
Subjective:  Patient ID: Brett Norman, male    DOB: [birth year]  Age: 45 y.o. MRN: [id number]  Chief Complaint  Patient presents with   Fatigue    HPI  Cari is a 45 year old Caucasian male that presents with fatigue and to discuss weight management. 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 States he sleeps well during the day but feels tired when awakening. Denies recent illnesses or depression.Zoe tells me that he used to be an avid runner but is unable to continue due to chronic knee pain. States he wants to sit on the couch and watch television. He states he walks and stand frequently at his job. He states he consumes a well-balanced healthy diet recently. No current outpatient medications on file prior to visit.   No current facility-administered medications on file prior to visit.   Past Medical History:  Diagnosis Date   Kidney stone    Past Surgical History:  Procedure Laterality Date   HERNIA REPAIR     age 40   KNEE ARTHROSCOPY     1996, 1998    SHOULDER ARTHROSCOPY W/ ROTATOR CUFF REPAIR Left    TONSILLECTOMY     age 26     Family History  Problem Relation Age of Onset   Breast cancer Mother    Osteoarthritis Father    Social History   Socioeconomic History   Marital status: Legally Separated    Spouse name: Not on file   Number of children: 3   Years of education: Not on file   Highest education level: High school graduate  Occupational History   Not on file  Tobacco Use   Smoking status: Never   Smokeless tobacco: Never  Vaping Use   Vaping Use: Never used  Substance and Sexual Activity   Alcohol use: Never   Drug use: Never   Sexual activity: Yes  Other Topics Concern   Not on file  Social History Narrative   Lives with son and his mother   Right handed   Caffeine: 2-3 sodas a day   Social Determinants of Health   Financial Resource Strain: Not on file  Food Insecurity: Not on file   Transportation Needs: Not on file  Physical Activity: Not on file  Stress: Not on file  Social Connections: Not on file    Review of Systems  Constitutional:  Positive for fatigue and unexpected weight change. Negative for appetite change and fever.  HENT:  Negative for congestion, ear pain and sore throat.   Eyes: Negative.   Respiratory:  Negative for cough and shortness of breath.   Cardiovascular:  Negative for chest pain and leg swelling.  Gastrointestinal:  Negative for abdominal pain, constipation, diarrhea, nausea and vomiting.  Endocrine: Negative.   Genitourinary:  Negative for dysuria and frequency.  Musculoskeletal:  Negative for arthralgias and myalgias.  Skin: Negative.   Allergic/Immunologic: Positive for environmental allergies.  Neurological:  Negative for dizziness and headaches.  Hematological: Negative.   Psychiatric/Behavioral:  Negative for dysphoric mood. The patient is not nervous/anxious.     Objective:  BP 122/72 (BP Location: Left Arm, Patient Position: Sitting)   Pulse 91   Temp 97.8 F (36.6 C) (Temporal)   Ht 5\' 7"  (1.702 m)   Wt 191 lb (86.6 kg)   SpO2 98%   BMI 29.91 kg/m   BP/Weight 04/21/2021 03/12/2021 12/16/2020  Systolic BP 122 129 116  Diastolic BP 72  78 80  Wt. (Lbs) 191 191.6 182.6  BMI 29.91 30.01 29.47    Physical Exam Vitals reviewed.  Constitutional:      Appearance: Normal appearance.  HENT:     Right Ear: Tympanic membrane normal.     Left Ear: Tympanic membrane normal.     Mouth/Throat:     Mouth: Mucous membranes are moist.  Cardiovascular:     Rate and Rhythm: Normal rate and regular rhythm.     Pulses: Normal pulses.     Heart sounds: Normal heart sounds.  Pulmonary:     Effort: Pulmonary effort is normal.     Breath sounds: Normal breath sounds.  Abdominal:     General: Bowel sounds are normal.     Palpations: Abdomen is soft.  Skin:    General: Skin is warm and dry.     Capillary Refill: Capillary refill  takes less than 2 seconds.  Neurological:     General: No focal deficit present.     Mental Status: He is alert and oriented to person, place, and time.  Psychiatric:        Mood and Affect: Mood normal.        Behavior: Behavior normal.      Lab Results  Component Value Date   WBC 7.8 12/16/2020   HGB 17.2 12/16/2020   HCT 50.5 12/16/2020   PLT 228 12/16/2020   GLUCOSE 112 (H) 12/16/2020   ALT 32 12/16/2020   AST 22 12/16/2020   NA 143 12/16/2020   K 4.2 12/16/2020   CL 102 12/16/2020   CREATININE 1.20 12/16/2020   BUN 15 12/16/2020   CO2 21 12/16/2020   TSH 1.500 12/16/2020      Assessment & Plan:    1. Other fatigue - Testosterone - Testosterone, free - Vitamin D, 25-hydroxy  2. Overweight - Vitamin D, 25-hydroxy - phentermine 37.5 MG capsule; Take 1 capsule (37.5 mg total) by mouth every morning.  Dispense: 30 capsule; Refill: 0 - CBC with Differential/Platelet - Comprehensive metabolic panel - F57 and Folate Panel  3. BMI 29.0-29.9,adult - phentermine 37.5 MG capsule; Take 1 capsule (37.5 mg total) by mouth every morning.  Dispense: 30 capsule; Refill: 0 - CBC with Differential/Platelet - Comprehensive metabolic panel - D22 and Folate Panel    Begin Phentermine 37.5 mg (take 1/2 tablet the first few days) Increase physical activity Consume heart healthy diet Increase water intake We will call you with lab results Follow-up in 6-weeks for weight check Notify office for any adverse side effects of medication   Follow-up: 6-weeks, weight management  An After Visit Summary was printed and given to the patient.  I,Lauren M Auman,acting as a Neurosurgeon for [employer], NP.,have documented all relevant documentation on the behalf of Janie Morning, NP,as directed by  Janie Morning, NP while in the presence of Janie Morning, NP.   I, Janie Morning, NP, have reviewed all documentation for this visit. The documentation on 04/21/21 for the exam,  diagnosis, procedures, and orders are all accurate and complete.   Janie Morning, NP [facility] [phone]

## 2021-04-24 LAB — CBC WITH DIFFERENTIAL/PLATELET
Basophils Absolute: 0 10*3/uL (ref 0.0–0.2)
Basos: 0 %
EOS (ABSOLUTE): 0.2 10*3/uL (ref 0.0–0.4)
Eos: 2 %
Hematocrit: 46.6 % (ref 37.5–51.0)
Hemoglobin: 16 g/dL (ref 13.0–17.7)
Immature Grans (Abs): 0 10*3/uL (ref 0.0–0.1)
Immature Granulocytes: 0 %
Lymphocytes Absolute: 3.6 10*3/uL — ABNORMAL HIGH (ref 0.7–3.1)
Lymphs: 41 %
MCH: 30.4 pg (ref 26.6–33.0)
MCHC: 34.3 g/dL (ref 31.5–35.7)
MCV: 89 fL (ref 79–97)
Monocytes Absolute: 0.5 10*3/uL (ref 0.1–0.9)
Monocytes: 6 %
Neutrophils Absolute: 4.4 10*3/uL (ref 1.4–7.0)
Neutrophils: 51 %
Platelets: 214 10*3/uL (ref 150–450)
RBC: 5.26 x10E6/uL (ref 4.14–5.80)
RDW: 12.2 % (ref 11.6–15.4)
WBC: 8.8 10*3/uL (ref 3.4–10.8)

## 2021-04-24 LAB — COMPREHENSIVE METABOLIC PANEL
ALT: 63 IU/L — ABNORMAL HIGH (ref 0–44)
AST: 29 IU/L (ref 0–40)
Albumin/Globulin Ratio: 2.4 — ABNORMAL HIGH (ref 1.2–2.2)
Albumin: 4.7 g/dL (ref 4.0–5.0)
Alkaline Phosphatase: 130 IU/L — ABNORMAL HIGH (ref 44–121)
BUN/Creatinine Ratio: 17 (ref 9–20)
BUN: 18 mg/dL (ref 6–24)
Bilirubin Total: 0.5 mg/dL (ref 0.0–1.2)
CO2: 22 mmol/L (ref 20–29)
Calcium: 8.7 mg/dL (ref 8.7–10.2)
Chloride: 102 mmol/L (ref 96–106)
Creatinine, Ser: 1.09 mg/dL (ref 0.76–1.27)
Globulin, Total: 2 g/dL (ref 1.5–4.5)
Glucose: 95 mg/dL (ref 65–99)
Potassium: 4.2 mmol/L (ref 3.5–5.2)
Sodium: 142 mmol/L (ref 134–144)
Total Protein: 6.7 g/dL (ref 6.0–8.5)
eGFR: 86 mL/min/{1.73_m2} (ref >59)

## 2021-04-24 LAB — TESTOSTERONE: Testosterone: 138 ng/dL — ABNORMAL LOW (ref 264–916)

## 2021-04-24 LAB — B12 AND FOLATE PANEL
Folate: 4.8 ng/mL (ref >3.0)
Vitamin B-12: 293 pg/mL (ref 232–1245)

## 2021-04-24 LAB — TESTOSTERONE, FREE: Testosterone, Free: 3.1 pg/mL — ABNORMAL LOW (ref 6.8–21.5)

## 2021-04-24 LAB — VITAMIN D 25 HYDROXY (VIT D DEFICIENCY, FRACTURES): Vit D, 25-Hydroxy: 21.8 ng/mL — ABNORMAL LOW (ref 30.0–100.0)

## 2021-04-25 ENCOUNTER — Other Ambulatory Visit: Payer: Self-pay

## 2021-04-25 ENCOUNTER — Other Ambulatory Visit: Payer: Self-pay | Admitting: Family Medicine

## 2021-04-25 MED ORDER — ERGOCALCIFEROL 1.25 MG (50000 UT) PO CAPS: 50000.0000 [IU] | ORAL_CAPSULE | ORAL | 0 refills | Status: DC

## 2021-04-25 MED ORDER — TESTOSTERONE 20.25 MG/1.25GM (1.62%) TD GEL: 2.0000 | Freq: Every day | TRANSDERMAL | 3 refills | Status: DC

## 2021-04-29 ENCOUNTER — Telehealth: Payer: Self-pay

## 2021-04-29 NOTE — Telephone Encounter (Signed)
LVM for patient about having trouble with getting auth for HST. Submitted fax on 03/17/21. Not rec'd reply back from insurance. Called on 8/8, 8/16, 8/23 and was on hold for 1.5 hrs each time with no answer from insurance rep.

## 2021-04-30 ENCOUNTER — Other Ambulatory Visit: Payer: Self-pay | Admitting: Family Medicine

## 2021-04-30 MED ORDER — TESTOSTERONE CYPIONATE 200 MG/ML IM SOLN: 200.0000 mg | INTRAMUSCULAR | 2 refills | Status: DC

## 2021-05-19 ENCOUNTER — Other Ambulatory Visit: Payer: Self-pay | Admitting: Nurse Practitioner

## 2021-05-19 DIAGNOSIS — R3912 Poor urinary stream: Secondary | ICD-10-CM

## 2021-05-19 DIAGNOSIS — N50812 Left testicular pain: Secondary | ICD-10-CM

## 2021-05-19 DIAGNOSIS — R3911 Hesitancy of micturition: Secondary | ICD-10-CM

## 2021-05-19 DIAGNOSIS — R748 Abnormal levels of other serum enzymes: Secondary | ICD-10-CM

## 2021-05-22 DIAGNOSIS — K648 Other hemorrhoids: Secondary | ICD-10-CM | POA: Diagnosis not present

## 2021-05-22 DIAGNOSIS — Z86010 Personal history of colonic polyps: Secondary | ICD-10-CM | POA: Diagnosis not present

## 2021-05-22 DIAGNOSIS — D125 Benign neoplasm of sigmoid colon: Secondary | ICD-10-CM | POA: Diagnosis not present

## 2021-05-22 DIAGNOSIS — K5730 Diverticulosis of large intestine without perforation or abscess without bleeding: Secondary | ICD-10-CM | POA: Diagnosis not present

## 2021-05-22 DIAGNOSIS — Z1211 Encounter for screening for malignant neoplasm of colon: Secondary | ICD-10-CM | POA: Diagnosis not present

## 2021-05-22 DIAGNOSIS — K635 Polyp of colon: Secondary | ICD-10-CM | POA: Diagnosis not present

## 2021-05-22 LAB — HM COLONOSCOPY

## 2021-05-23 ENCOUNTER — Other Ambulatory Visit: Payer: BC Managed Care – PPO

## 2021-05-23 DIAGNOSIS — N50812 Left testicular pain: Secondary | ICD-10-CM | POA: Diagnosis not present

## 2021-05-23 DIAGNOSIS — N50811 Right testicular pain: Secondary | ICD-10-CM | POA: Diagnosis not present

## 2021-05-23 DIAGNOSIS — R3911 Hesitancy of micturition: Secondary | ICD-10-CM | POA: Diagnosis not present

## 2021-05-23 DIAGNOSIS — R3912 Poor urinary stream: Secondary | ICD-10-CM | POA: Diagnosis not present

## 2021-05-24 LAB — CBC WITH DIFFERENTIAL/PLATELET
Basophils Absolute: 0 10*3/uL (ref 0.0–0.2)
Basos: 0 %
EOS (ABSOLUTE): 0.1 10*3/uL (ref 0.0–0.4)
Eos: 1 %
Hematocrit: 50.7 % (ref 37.5–51.0)
Hemoglobin: 17 g/dL (ref 13.0–17.7)
Immature Grans (Abs): 0 10*3/uL (ref 0.0–0.1)
Immature Granulocytes: 0 %
Lymphocytes Absolute: 2.4 10*3/uL (ref 0.7–3.1)
Lymphs: 30 %
MCH: 29.7 pg (ref 26.6–33.0)
MCHC: 33.5 g/dL (ref 31.5–35.7)
MCV: 89 fL (ref 79–97)
Monocytes Absolute: 0.5 10*3/uL (ref 0.1–0.9)
Monocytes: 6 %
Neutrophils Absolute: 4.9 10*3/uL (ref 1.4–7.0)
Neutrophils: 63 %
Platelets: 223 10*3/uL (ref 150–450)
RBC: 5.72 x10E6/uL (ref 4.14–5.80)
RDW: 11.8 % (ref 11.6–15.4)
WBC: 8 10*3/uL (ref 3.4–10.8)

## 2021-05-24 LAB — COMPREHENSIVE METABOLIC PANEL
ALT: 35 IU/L (ref 0–44)
AST: 18 IU/L (ref 0–40)
Albumin/Globulin Ratio: 2.4 — ABNORMAL HIGH (ref 1.2–2.2)
Albumin: 4.6 g/dL (ref 4.0–5.0)
Alkaline Phosphatase: 121 IU/L (ref 44–121)
BUN/Creatinine Ratio: 12 (ref 9–20)
BUN: 15 mg/dL (ref 6–24)
Bilirubin Total: 0.4 mg/dL (ref 0.0–1.2)
CO2: 24 mmol/L (ref 20–29)
Calcium: 9.5 mg/dL (ref 8.7–10.2)
Chloride: 99 mmol/L (ref 96–106)
Creatinine, Ser: 1.3 mg/dL — ABNORMAL HIGH (ref 0.76–1.27)
Globulin, Total: 1.9 g/dL (ref 1.5–4.5)
Glucose: 101 mg/dL — ABNORMAL HIGH (ref 65–99)
Potassium: 4.4 mmol/L (ref 3.5–5.2)
Sodium: 145 mmol/L — ABNORMAL HIGH (ref 134–144)
Total Protein: 6.5 g/dL (ref 6.0–8.5)
eGFR: 69 mL/min/{1.73_m2} (ref >59)

## 2021-05-24 LAB — HCV INTERPRETATION

## 2021-05-24 LAB — ACUTE VIRAL HEPATITIS (HAV, HBV, HCV)
HCV Ab: <0.1 s/co ratio (ref 0.0–0.9)
Hep A IgM: NEGATIVE
Hep B C IgM: NEGATIVE
Hepatitis B Surface Ag: NEGATIVE

## 2021-05-24 LAB — PSA: Prostate Specific Ag, Serum: 0.8 ng/mL (ref 0.0–4.0)

## 2021-05-28 ENCOUNTER — Other Ambulatory Visit: Payer: Self-pay | Admitting: Nurse Practitioner

## 2021-05-28 DIAGNOSIS — E663 Overweight: Secondary | ICD-10-CM

## 2021-05-28 DIAGNOSIS — Z6829 Body mass index (BMI) 29.0-29.9, adult: Secondary | ICD-10-CM

## 2021-05-28 MED ORDER — PHENTERMINE HCL 37.5 MG PO CAPS: 37.5000 mg | ORAL_CAPSULE | ORAL | 0 refills | Status: DC

## 2021-06-25 DIAGNOSIS — R3915 Urgency of urination: Secondary | ICD-10-CM | POA: Diagnosis not present

## 2021-06-25 DIAGNOSIS — E291 Testicular hypofunction: Secondary | ICD-10-CM | POA: Diagnosis not present

## 2021-06-25 DIAGNOSIS — R3912 Poor urinary stream: Secondary | ICD-10-CM | POA: Diagnosis not present

## 2021-06-25 DIAGNOSIS — Z87442 Personal history of urinary calculi: Secondary | ICD-10-CM | POA: Diagnosis not present

## 2021-07-08 ENCOUNTER — Other Ambulatory Visit: Payer: Self-pay | Admitting: Nurse Practitioner

## 2021-07-08 DIAGNOSIS — E663 Overweight: Secondary | ICD-10-CM

## 2021-07-08 DIAGNOSIS — Z6829 Body mass index (BMI) 29.0-29.9, adult: Secondary | ICD-10-CM

## 2021-07-08 MED ORDER — PHENTERMINE HCL 37.5 MG PO CAPS: 37.5000 mg | ORAL_CAPSULE | ORAL | 0 refills | Status: DC

## 2021-08-12 ENCOUNTER — Encounter: Payer: Self-pay | Admitting: Physician Assistant

## 2021-08-12 ENCOUNTER — Ambulatory Visit: Payer: BC Managed Care – PPO | Admitting: Physician Assistant

## 2021-08-12 VITALS — BP 122/78 | HR 102 | Temp 97.4°F | Ht 67.0 in | Wt 178.0 lb

## 2021-08-12 DIAGNOSIS — J060 Acute laryngopharyngitis: Secondary | ICD-10-CM

## 2021-08-12 MED ORDER — NOREL AD 4-10-325 MG PO TABS: ORAL_TABLET | ORAL | 0 refills | Status: DC

## 2021-08-12 MED ORDER — ERGOCALCIFEROL 1.25 MG (50000 UT) PO CAPS: 50000.0000 [IU] | ORAL_CAPSULE | ORAL | 1 refills | Status: DC

## 2021-08-12 MED ORDER — AZITHROMYCIN 250 MG PO TABS: ORAL_TABLET | ORAL | 0 refills | Status: AC

## 2021-08-12 NOTE — Progress Notes (Signed)
Acute Office Visit  Subjective:    Patient ID: Brett Norman, male    DOB: [birth year], 45 y.o.   MRN: [id number]  Chief Complaint  Patient presents with   Sinusitis    Sinus pressure/headache    HPI: Patient is in today for complaints of sinus pressure, drainage and headache -- states symptoms started on Friday with a sore throat and have progressed Denies fever - no COVID or flu exposure Has had a dry cough  Past Medical History:  Diagnosis Date   Kidney stone     Past Surgical History:  Procedure Laterality Date   HERNIA REPAIR     age 56   KNEE ARTHROSCOPY     1996, 1998    SHOULDER ARTHROSCOPY W/ ROTATOR CUFF REPAIR Left    TONSILLECTOMY     age 37     Family History  Problem Relation Age of Onset   Breast cancer Mother    Osteoarthritis Father     Social History   Socioeconomic History   Marital status: Legally Separated    Spouse name: Not on file   Number of children: 3   Years of education: Not on file   Highest education level: High school graduate  Occupational History   Not on file  Tobacco Use   Smoking status: Never   Smokeless tobacco: Never  Vaping Use   Vaping Use: Never used  Substance and Sexual Activity   Alcohol use: Never   Drug use: Never   Sexual activity: Yes  Other Topics Concern   Not on file  Social History Narrative   Lives with son and his mother   Right handed   Caffeine: 2-3 sodas a day   Social Determinants of Health   Financial Resource Strain: Not on file  Food Insecurity: Not on file  Transportation Needs: Not on file  Physical Activity: Not on file  Stress: Not on file  Social Connections: Not on file  Intimate Partner Violence: Not on file    Outpatient Medications Prior to Visit  Medication Sig Dispense Refill   phentermine 37.5 MG capsule Take 1 capsule (37.5 mg total) by mouth every morning. 30 capsule 0   ergocalciferol (VITAMIN D2) 1.25 MG (50000 UT) capsule Take 1 capsule (50,000 Units total) by  mouth once a week. 12 capsule 0   testosterone cypionate (DEPOTESTOSTERONE CYPIONATE) 200 MG/ML injection Inject 1 mL (200 mg total) into the muscle every 14 (fourteen) days. 2 mL 2   No facility-administered medications prior to visit.    Allergies  Allergen Reactions   Codeine Hives    Review of Systems CONSTITUTIONAL: Negative for chills, fatigue, fever, unintentional weight gain and unintentional weight loss.  E/N/T: see HPI CARDIOVASCULAR: Negative for chest pain, dizziness, palpitations and pedal edema.  RESPIRATORY: see HPI GASTROINTESTINAL: Negative for abdominal pain, acid reflux symptoms, constipation, diarrhea, nausea and vomiting.  NEUROLOGICAL: Negative for dizziness and headaches.          Objective:    Physical Exam PHYSICAL EXAM:   VS: BP 122/78 (BP Location: Right Arm, Patient Position: Sitting)   Pulse (!) 102   Temp (!) 97.4 F (36.3 C) (Temporal)   Ht 5' 7"  (1.702 m)   Wt 178 lb (80.7 kg)   SpO2 97%   BMI 27.88 kg/m   GEN: Well nourished, well developed, in no acute distress  HEENT: normal external ears and nose - normal external auditory canals and TMS - - Lips, Teeth and Gums -  normal  Oropharynx - erythema/pnd - frontal sinus tenderness Cardiac: RRR; no murmurs, Respiratory:  normal respiratory rate and pattern with no distress - normal breath sounds with no rales, rhonchi, wheezes or rubs   BP 122/78 (BP Location: Right Arm, Patient Position: Sitting)   Pulse (!) 102   Temp (!) 97.4 F (36.3 C) (Temporal)   Ht 5' 7"  (1.702 m)   Wt 178 lb (80.7 kg)   SpO2 97%   BMI 27.88 kg/m  Wt Readings from Last 3 Encounters:  08/12/21 178 lb (80.7 kg)  04/21/21 191 lb (86.6 kg)  03/12/21 191 lb 9.6 oz (86.9 kg)    Health Maintenance Due  Topic Date Due   COVID-19 Vaccine (1) Never done   HIV Screening  Never done   INFLUENZA VACCINE  Never done    There are no preventive care reminders to display for this patient.   Lab Results   Component Value Date   TSH 1.500 12/16/2020   Lab Results  Component Value Date   WBC 8.0 05/23/2021   HGB 17.0 05/23/2021   HCT 50.7 05/23/2021   MCV 89 05/23/2021   PLT 223 05/23/2021   Lab Results  Component Value Date   NA 145 (H) 05/23/2021   K 4.4 05/23/2021   CO2 24 05/23/2021   GLUCOSE 101 (H) 05/23/2021   BUN 15 05/23/2021   CREATININE 1.30 (H) 05/23/2021   BILITOT 0.4 05/23/2021   ALKPHOS 121 05/23/2021   AST 18 05/23/2021   ALT 35 05/23/2021   PROT 6.5 05/23/2021   ALBUMIN 4.6 05/23/2021   CALCIUM 9.5 05/23/2021   EGFR 69 05/23/2021   No results found for: CHOL No results found for: HDL No results found for: LDLCALC No results found for: TRIG No results found for: CHOLHDL No results found for: HGBA1C     Assessment & Plan:   Problem List Items Addressed This Visit   None Visit Diagnoses     Acute laryngopharyngitis    -  Primary   Relevant Medications   azithromycin (ZITHROMAX) 250 MG tablet   Chlorphen-PE-Acetaminophen (NOREL AD) 4-10-325 MG TABS      Meds ordered this encounter  Medications   azithromycin (ZITHROMAX) 250 MG tablet    Sig: Take 2 tablets on day 1, then 1 tablet daily on days 2 through 5    Dispense:  6 tablet    Refill:  0    Order Specific Question:   Supervising Provider    Answer:   Shelton Silvas   Chlorphen-PE-Acetaminophen (NOREL AD) 4-10-325 MG TABS    Sig: 1 po q 6 hours prn congestion    Dispense:  12 tablet    Refill:  0    Order Specific Question:   Supervising Provider    Answer:   Rochel Brome [[id number]]   ergocalciferol (VITAMIN D2) 1.25 MG (50000 UT) capsule    Sig: Take 1 capsule (50,000 Units total) by mouth once a week.    Dispense:  12 capsule    Refill:  1    Order Specific Question:   Supervising Provider    Answer:   Shelton Silvas    No orders of the defined types were placed in this encounter.    Follow-up: Return if symptoms worsen or fail to improve.  An After Visit Summary  was printed and given to the patient.  Yetta Flock [facility] [phone]

## 2021-08-22 ENCOUNTER — Other Ambulatory Visit: Payer: Self-pay | Admitting: Nurse Practitioner

## 2021-08-22 DIAGNOSIS — E663 Overweight: Secondary | ICD-10-CM

## 2021-08-22 DIAGNOSIS — Z6829 Body mass index (BMI) 29.0-29.9, adult: Secondary | ICD-10-CM

## 2021-08-26 MED ORDER — PHENTERMINE HCL 37.5 MG PO CAPS: 37.5000 mg | ORAL_CAPSULE | ORAL | 0 refills | Status: DC

## 2021-08-27 ENCOUNTER — Encounter: Payer: Self-pay | Admitting: Nurse Practitioner

## 2021-08-27 ENCOUNTER — Other Ambulatory Visit: Payer: Self-pay

## 2021-08-27 ENCOUNTER — Ambulatory Visit: Payer: BC Managed Care – PPO | Admitting: Nurse Practitioner

## 2021-08-27 DIAGNOSIS — E663 Overweight: Secondary | ICD-10-CM

## 2021-08-27 DIAGNOSIS — Z6829 Body mass index (BMI) 29.0-29.9, adult: Secondary | ICD-10-CM | POA: Diagnosis not present

## 2021-08-27 NOTE — Patient Instructions (Signed)
Resume Adipex 37.5 mg daily Follow-up in 2-months    Exercising to Lose Weight Getting regular exercise is important for everyone. It is especially important if you are overweight. Being overweight increases your risk of heart disease, stroke, diabetes, high blood pressure, and several types of cancer. Exercising, and reducing the calories you consume, can help you lose weight and improve fitness and health. Exercise can be moderate or vigorous intensity. To lose weight, most people need to do a certain amount of moderate or vigorous-intensity exercise each week. How can exercise affect me? You lose weight when you exercise enough to burn more calories than you eat. Exercise also reduces body fat and builds muscle. The more muscle you have, the more calories you burn. Exercise also: Improves mood. Reduces stress and tension. Improves your overall fitness, flexibility, and endurance. Increases bone strength. Moderate-intensity exercise Moderate-intensity exercise is any activity that gets you moving enough to burn at least three times more energy (calories) than if you were sitting. Examples of moderate exercise include: Walking a mile in 15 minutes. Doing light yard work. Biking at an easy pace. Most people should get at least 150 minutes of moderate-intensity exercise a week to maintain their body weight. Vigorous-intensity exercise Vigorous-intensity exercise is any activity that gets you moving enough to burn at least six times more calories than if you were sitting. When you exercise at this intensity, you should be working hard enough that you are not able to carry on a conversation. Examples of vigorous exercise include: Running. Playing a team sport, such as football, basketball, and soccer. Jumping rope. Most people should get at least 75 minutes a week of vigorous exercise to maintain their body weight. What actions can I take to lose weight? The amount of exercise you need to  lose weight depends on: Your age. The type of exercise. Any health conditions you have. Your overall physical ability. Talk to your health care provider about how much exercise you need and what types of activities are safe for you. Nutrition  Make changes to your diet as told by your health care provider or diet and nutrition specialist (dietitian). This may include: Eating fewer calories. Eating more protein. Eating less unhealthy fats. Eating a diet that includes fresh fruits and vegetables, whole grains, low-fat dairy products, and lean protein. Avoiding foods with added fat, salt, and sugar. Drink plenty of water while you exercise to prevent dehydration or heat stroke. Activity Choose an activity that you enjoy and set realistic goals. Your health care provider can help you make an exercise plan that works for you. Exercise at a moderate or vigorous intensity most days of the week. The intensity of exercise may vary from person to person. You can tell how intense a workout is for you by paying attention to your breathing and heartbeat. Most people will notice their breathing and heartbeat get faster with more intense exercise. Do resistance training twice each week, such as: Push-ups. Sit-ups. Lifting weights. Using resistance bands. Getting short amounts of exercise can be just as helpful as long, structured periods of exercise. If you have trouble finding time to exercise, try doing these things as part of your daily routine: Get up, stretch, and walk around every 30 minutes throughout the day. Go for a walk during your lunch break. Park your car farther away from your destination. If you take public transportation, get off one stop early and walk the rest of the way. Make phone calls while standing up and  walking around. Take the stairs instead of elevators or escalators. Wear comfortable clothes and shoes with good support. Do not exercise so much that you hurt yourself,  feel dizzy, or get very short of breath. Where to find more information [place] Department of Health and Human Services: ThisPath.fi Centers for Disease Control and Prevention: FootballExhibition.com.br Contact a health care provider: Before starting a new exercise program. If you have questions or concerns about your weight. If you have a medical problem that keeps you from exercising. Get help right away if: You have any of the following while exercising: Injury. Dizziness. Difficulty breathing or shortness of breath that does not go away when you stop exercising. Chest pain. Rapid heartbeat. These symptoms may represent a serious problem that is an emergency. Do not wait to see if the symptoms will go away. Get medical help right away. Call your local emergency services (911 in the [place] Do not drive yourself to the hospital. Summary Getting regular exercise is especially important if you are overweight. Being overweight increases your risk of heart disease, stroke, diabetes, high blood pressure, and several types of cancer. Losing weight happens when you burn more calories than you eat. Reducing the amount of calories you eat, and getting regular moderate or vigorous exercise each week, helps you lose weight. This information is not intended to replace advice given to you by your health care provider. Make sure you discuss any questions you have with your health care provider. Document Revised: 10/20/2020 Document Reviewed: 10/20/2020 Elsevier Patient Education  2022 Else[employer] Inc.       Hypogonadism, Male Male hypogonadism is a condition of having a level of testosterone that is lower than normal. Testosterone is a chemical, or hormone, that is made mainly in the testicles. In boys, testosterone is responsible for the development of male characteristics during puberty. These include: Making the penis bigger. Growing and building the muscles. Growing facial hair. Deepening the voice. In  adult men, testosterone is responsible for maintaining: An interest in sex and the ability to have sex. Muscle mass. Sperm production. Red blood cell production. Bone strength. Testosterone also gives men energy and a sense of well-being. Testosterone normally decreases as men age and the testicles make less testosterone. Testosterone levels can vary from man to man. Not all men will have signs and symptoms of low testosterone. Weight, alcohol use, medicines, and certain medical conditions can affect a man's testosterone level. What are the causes? This condition is caused by: A natural decrease in testosterone that occurs as a man grows older. This is the main cause of this condition. Use of medicines, such as antidepressants, steroids, and opioids. Diseases and conditions that affect the testicles or the making of testosterone. These include: Injury or damage to the testicles from trauma, cancer, cancer treatment, or infection. Diabetes. Sleep apnea. Genetic conditions that men are born with. Disease of the pituitary gland. This gland is in the brain. It produces hormones. Obesity. Metabolic syndrome. This is a group of diseases that affect blood pressure, blood sugar, cholesterol, and belly fat. HIV or AIDS. Alcohol abuse. Kidney failure. Other long-term or chronic diseases. What are the signs or symptoms? Common symptoms of this condition include: Loss of interest in sex (low sex drive). Inability to have or maintain an erection (erectile dysfunction). Feeling tired (fatigue). Mood changes, like irritability or depression. Loss of muscle and body hair. Infertility. Large breasts. Weight gain (obesity). How is this diagnosed? Your health care provider can diagnose hypogonadism based on:  Your signs and symptoms. A physical exam to check your testosterone levels. This includes blood tests. Testosterone levels can change throughout the day. Levels are highest in the morning. You  may need to have repeat blood tests before getting a diagnosis of hypogonadism. Depending on your medical history and test results, your health care provider may also do other tests to find the cause of low testosterone. How is this treated? This condition is treated with testosterone replacement therapy. Testosterone can be given by: Injection or through pellets inserted under the skin. Gels or patches placed on the skin or in the mouth. Testosterone therapy is not for everyone. It has risks and side effects. Your health care provider will consider your medical history, your risk for prostate cancer, your age, and your symptoms before putting you on testosterone replacement therapy. Follow these instructions at home: Take over-the-counter and prescription medicines only as told by your health care provider. Eat foods that are high in fiber, such as beans, whole grains, and fresh fruits and vegetables. Limit foods that are high in fat and processed sugars, such as fried or sweet foods. If you drink alcohol: Limit how much you have to 0-2 drinks a day. Know how much alcohol is in your drink. In the [place] one drink equals one 12 oz bottle of beer (355 mL), one 5 oz glass of wine (148 mL), or one 1 oz glass of hard liquor (44 mL). Return to your normal activities as told by your health care provider. Ask your health care provider what activities are safe for you. Keep all follow-up visits. This is important. Contact a health care provider if: You have any of the signs or symptoms of low testosterone. You have any side effects from testosterone therapy. Summary Male hypogonadism is a condition of having a level of testosterone that is lower than normal. The natural drop in testosterone production that occurs with age is the most common cause of this condition. Low testosterone can also be caused by many diseases and conditions that affect the testicles and the making of testosterone. This  condition is treated with testosterone replacement therapy. There are risks and side effects of testosterone therapy. Your health care provider will consider your age, medical history, symptoms, and risks for prostate cancer before putting you on testosterone therapy. This information is not intended to replace advice given to you by your health care provider. Make sure you discuss any questions you have with your health care provider. Document Revised: 04/25/2020 Document Reviewed: 04/25/2020 Elsevier Patient Education  2022 [employer].

## 2021-08-27 NOTE — Progress Notes (Signed)
Subjective:  Patient ID: Brett Norman, male    DOB: 05/16/1976  Age: 45 y.o. MRN: [id number]  Chief Complaint  Patient presents with   Weight Check    HPI Brett Norman is a 45 year old Caucasian male that presents for follow-up of weight management. He was prescribed Adipex 37.5 mg daily on 04/21/21.He states he initially lost weight and had decreased fatigue. Denies side effects of medication.   Hypogonadism He was found to have low testosterone, referred to urology. Urologist discontinued testosterone to better evaluate cause of hypogonadism. He tells me he has experienced slight weight gain and fatigue since d/c testosterone. He is scheduled to follow-up with urology in a few weeks.    Current Outpatient Medications on File Prior to Visit  Medication Sig Dispense Refill   Chlorphen-PE-Acetaminophen (NOREL AD) 4-10-325 MG TABS 1 po q 6 hours prn congestion 12 tablet 0   ergocalciferol (VITAMIN D2) 1.25 MG (50000 UT) capsule Take 1 capsule (50,000 Units total) by mouth once a week. 12 capsule 1   phentermine 37.5 MG capsule Take 1 capsule (37.5 mg total) by mouth every morning. 30 capsule 0   No current facility-administered medications on file prior to visit.   Past Medical History:  Diagnosis Date   Kidney stone    Past Surgical History:  Procedure Laterality Date   HERNIA REPAIR     age 57   KNEE ARTHROSCOPY     1996, 1998    SHOULDER ARTHROSCOPY W/ ROTATOR CUFF REPAIR Left    TONSILLECTOMY     age 66     Family History  Problem Relation Age of Onset   Breast cancer Mother    Osteoarthritis Father    Social History   Socioeconomic History   Marital status: Legally Separated    Spouse name: Not on file   Number of children: 3   Years of education: Not on file   Highest education level: High school graduate  Occupational History   Not on file  Tobacco Use   Smoking status: Never   Smokeless tobacco: Never  Vaping Use   Vaping Use: Never used  Substance and Sexual  Activity   Alcohol use: Never   Drug use: Never   Sexual activity: Yes  Other Topics Concern   Not on file  Social History Narrative   Lives with son and his mother   Right handed   Caffeine: 2-3 sodas a day   Social Determinants of Health   Financial Resource Strain: Not on file  Food Insecurity: Not on file  Transportation Needs: Not on file  Physical Activity: Not on file  Stress: Not on file  Social Connections: Not on file    Review of Systems  Constitutional:  Positive for fatigue. Negative for appetite change and unexpected weight change.  HENT:  Negative for congestion, ear pain, rhinorrhea, sinus pressure, sinus pain and tinnitus.   Eyes:  Negative for pain.  Respiratory:  Negative for cough and shortness of breath.   Cardiovascular:  Negative for chest pain, palpitations and leg swelling.  Gastrointestinal:  Negative for abdominal pain, constipation, diarrhea, nausea and vomiting.  Endocrine: Negative for cold intolerance, heat intolerance, polydipsia, polyphagia and polyuria.  Genitourinary:  Negative for dysuria, frequency and hematuria.  Musculoskeletal:  Negative for arthralgias, back pain, joint swelling and myalgias.  Skin:  Negative for rash.  Allergic/Immunologic: Negative for environmental allergies.  Neurological:  Positive for headaches. Negative for dizziness.  Hematological:  Negative for adenopathy.  Psychiatric/Behavioral:  Negative  for decreased concentration and sleep disturbance. The patient is not nervous/anxious.     Objective:  BP 118/78    Pulse 97    Resp 18    Ht 5\' 6"  (1.676 m)    Wt 178 lb 9.6 oz (81 kg)    SpO2 97%    BMI 28.83 kg/m   BP/Weight 08/27/2021 08/12/2021 123XX123  Systolic BP [id number]  Diastolic BP 78 78 72  Wt. (Lbs) 178.6 178 191  BMI 28.83 27.88 29.91    Physical Exam Vitals reviewed.  Skin:    General: Skin is warm and dry.     Capillary Refill: Capillary refill takes less than 2 seconds.  Neurological:      General: No focal deficit present.     Mental Status: He is alert and oriented to person, place, and time.  Psychiatric:        Mood and Affect: Mood normal.        Behavior: Behavior normal.       Lab Results  Component Value Date   WBC 8.0 05/23/2021   HGB 17.0 05/23/2021   HCT 50.7 05/23/2021   PLT 223 05/23/2021   GLUCOSE 101 (H) 05/23/2021   ALT 35 05/23/2021   AST 18 05/23/2021   NA 145 (H) 05/23/2021   K 4.4 05/23/2021   CL 99 05/23/2021   CREATININE 1.30 (H) 05/23/2021   BUN 15 05/23/2021   CO2 24 05/23/2021   TSH 1.500 12/16/2020      Assessment & Plan:   1. Overweight -continue Adipex 37.5 mg daily -heart healthy diet -increase physical activity  2. BMI 29.0-29.9,adult   Resume Adipex 37.5 mg daily Follow-up in 51-months       Follow-up: 60-months, fasting  An After Visit Summary was printed and given to the patient.  I, Brett Harbour, NP, have reviewed all documentation for this visit. The documentation on 08/27/21 for the exam, diagnosis, procedures, and orders are all accurate and complete.   Signed, Brett Harbour, NP [place] [phone]

## 2021-09-15 DIAGNOSIS — E291 Testicular hypofunction: Secondary | ICD-10-CM | POA: Diagnosis not present

## 2021-09-22 DIAGNOSIS — N401 Enlarged prostate with lower urinary tract symptoms: Secondary | ICD-10-CM | POA: Diagnosis not present

## 2021-09-22 DIAGNOSIS — E291 Testicular hypofunction: Secondary | ICD-10-CM | POA: Diagnosis not present

## 2021-09-22 DIAGNOSIS — R3914 Feeling of incomplete bladder emptying: Secondary | ICD-10-CM | POA: Diagnosis not present

## 2021-09-22 DIAGNOSIS — R3912 Poor urinary stream: Secondary | ICD-10-CM | POA: Diagnosis not present

## 2021-09-22 DIAGNOSIS — R3915 Urgency of urination: Secondary | ICD-10-CM | POA: Diagnosis not present

## 2021-10-14 ENCOUNTER — Other Ambulatory Visit: Payer: Self-pay | Admitting: Nurse Practitioner

## 2021-10-14 DIAGNOSIS — E663 Overweight: Secondary | ICD-10-CM

## 2021-10-14 DIAGNOSIS — Z6829 Body mass index (BMI) 29.0-29.9, adult: Secondary | ICD-10-CM

## 2021-10-14 MED ORDER — ERGOCALCIFEROL 1.25 MG (50000 UT) PO CAPS: 50000.0000 [IU] | ORAL_CAPSULE | ORAL | 1 refills | Status: DC

## 2021-10-14 MED ORDER — PHENTERMINE HCL 37.5 MG PO CAPS: 37.5000 mg | ORAL_CAPSULE | ORAL | 0 refills | Status: DC

## 2021-10-21 ENCOUNTER — Encounter: Payer: Self-pay | Admitting: Nurse Practitioner

## 2021-10-21 ENCOUNTER — Telehealth (INDEPENDENT_AMBULATORY_CARE_PROVIDER_SITE_OTHER): Payer: BC Managed Care – PPO | Admitting: Nurse Practitioner

## 2021-10-21 DIAGNOSIS — R051 Acute cough: Secondary | ICD-10-CM | POA: Diagnosis not present

## 2021-10-21 DIAGNOSIS — Z20822 Contact with and (suspected) exposure to covid-19: Secondary | ICD-10-CM | POA: Diagnosis not present

## 2021-10-21 DIAGNOSIS — U071 COVID-19: Secondary | ICD-10-CM

## 2021-10-21 LAB — POC COVID19 BINAXNOW: SARS Coronavirus 2 Ag: POSITIVE — AB

## 2021-10-21 MED ORDER — FLUTICASONE PROPIONATE 50 MCG/ACT NA SUSP: 2.0000 | Freq: Every day | NASAL | 6 refills | Status: DC

## 2021-10-21 MED ORDER — BENZONATATE 100 MG PO CAPS: 100.0000 mg | ORAL_CAPSULE | Freq: Two times a day (BID) | ORAL | 0 refills | Status: DC | PRN

## 2021-10-21 NOTE — Progress Notes (Addendum)
Virtual Visit via Video Note   This visit type was conducted due to national recommendations for restrictions regarding the COVID-19 Pandemic (e.g. social distancing) in an effort to limit this patient's exposure and mitigate transmission in our community.  Due to his co-morbid illnesses, this patient is at least at moderate risk for complications without adequate follow up.  This format is felt to be most appropriate for this patient at this time.  All issues noted in this document were discussed and addressed.  A limited physical exam was performed with this format.  A verbal consent was obtained for the virtual visit.   Date:  10/21/2021   ID:  Brett Norman, DOB [birth year], MRN [id number]  Patient Location: Home Provider Location: Office/Clinic  PCP:  Janie Morning, NP   Evaluation Performed:  Established patient, acute telemedicine visit  Chief Complaint:  cough  History of Present Illness:    Brett Norman is a 46 y.o. male with URI symptoms of cough, sinus congestion, headache, and body aches. Onset of symptoms was yesterday. Treatment has included Norel. He was exposed to COVID-19 by a co-worker recently.   The patient does have symptoms concerning for COVID-19 infection (fever, chills, cough, or new shortness of breath).    Past Medical History:  Diagnosis Date   Kidney stone     Past Surgical History:  Procedure Laterality Date   HERNIA REPAIR     age 89   KNEE ARTHROSCOPY     1996, 1998    SHOULDER ARTHROSCOPY W/ ROTATOR CUFF REPAIR Left    TONSILLECTOMY     age 66     Family History  Problem Relation Age of Onset   Breast cancer Mother    Osteoarthritis Father     Social History   Socioeconomic History   Marital status: Legally Separated    Spouse name: Not on file   Number of children: 3   Years of education: Not on file   Highest education level: High school graduate  Occupational History   Not on file  Tobacco Use   Smoking status: Never    Smokeless tobacco: Never  Vaping Use   Vaping Use: Never used  Substance and Sexual Activity   Alcohol use: Never   Drug use: Never   Sexual activity: Yes  Other Topics Concern   Not on file  Social History Narrative   Lives with son and his mother   Right handed   Caffeine: 2-3 sodas a day   Social Determinants of Health   Financial Resource Strain: Not on file  Food Insecurity: Not on file  Transportation Needs: Not on file  Physical Activity: Not on file  Stress: Not on file  Social Connections: Not on file  Intimate Partner Violence: Not on file    Outpatient Medications Prior to Visit  Medication Sig Dispense Refill   Chlorphen-PE-Acetaminophen (NOREL AD) 4-10-325 MG TABS 1 po q 6 hours prn congestion 12 tablet 0   ergocalciferol (VITAMIN D2) 1.25 MG (50000 UT) capsule Take 1 capsule (50,000 Units total) by mouth once a week. 12 capsule 1   phentermine 37.5 MG capsule Take 1 capsule (37.5 mg total) by mouth every morning. 30 capsule 0   No facility-administered medications prior to visit.    Allergies:   Codeine   Social History   Tobacco Use   Smoking status: Never   Smokeless tobacco: Never  Vaping Use   Vaping Use: Never used  Substance Use Topics  Alcohol use: Never   Drug use: Never     Review of Systems  Constitutional:  Positive for malaise/fatigue.  Respiratory:  Positive for cough. Negative for shortness of breath.   Musculoskeletal:  Positive for myalgias (generalized body aches).  Neurological:  Positive for headaches.  All other systems reviewed and are negative.   Labs/Other Tests and Data Reviewed:    Recent Labs: 12/16/2020: TSH 1.500 05/23/2021: ALT 35; BUN 15; Creatinine, Ser 1.30; Hemoglobin 17.0; Platelets 223; Potassium 4.4; Sodium 145   Recent Lipid Panel No results found for: CHOL, TRIG, HDL, CHOLHDL, LDLCALC, LDLDIRECT  Wt Readings from Last 3 Encounters:  08/27/21 178 lb 9.6 oz (81 kg)  08/12/21 178 lb (80.7 kg)  04/21/21  191 lb (86.6 kg)     Objective:    Vital Signs:  There were no vitals taken for this visit.    Physical Exam No physical exam due to telemedicine visit  ASSESSMENT & PLAN:     1. COVID-19-positive - fluticasone (FLONASE) 50 MCG/ACT nasal spray; Place 2 sprays into both nostrils daily.  Dispense: 16 g; Refill: 6  2. Exposure to COVID-19 virus-positive  3. Acute cough - POC COVID-19-positive - benzonatate (TESSALON) 100 MG capsule; Take 1 capsule (100 mg total) by mouth 2 (two) times daily as needed for cough.  Dispense: 20 capsule; Refill: 0   Rest and push fluids Symptom management with OTC medications (Norel) Seek emergency medical care for any severe worsening or concerning symptoms Follow-up as needed  Problem List Items Addressed This Visit   None Visit Diagnoses     COVID-19    -  Primary   Relevant Medications   fluticasone (FLONASE) 50 MCG/ACT nasal spray   Acute cough       Relevant Medications   benzonatate (TESSALON) 100 MG capsule   Other Relevant Orders   POC COVID-19 (Completed)     .  Orders Placed This Encounter  Procedures   POC COVID-19     Meds ordered this encounter  Medications   benzonatate (TESSALON) 100 MG capsule    Sig: Take 1 capsule (100 mg total) by mouth 2 (two) times daily as needed for cough.    Dispense:  20 capsule    Refill:  0    Order Specific Question:   Supervising Provider    Answer:   COX, Aniceto Boss   fluticasone (FLONASE) 50 MCG/ACT nasal spray    Sig: Place 2 sprays into both nostrils daily.    Dispense:  16 g    Refill:  6    Order Specific Question:   Supervising Provider    AnswerCorey Harold    COVID-19 Education: The signs and symptoms of COVID-19 were discussed with the patient and how to seek care for testing (follow up with PCP or arrange E-visit). The importance of social distancing was discussed today.   I spent 10  minutes dedicated to the care of this patient on the date of  this encounter to include face-to-face time with the patient, as well as: EMR review and prescription medication management.  Follow Up:  In Person prn  Signed, Janie Morning, NP  10/21/2021 4:04 PM    [facility]

## 2021-10-23 ENCOUNTER — Telehealth: Payer: Self-pay | Admitting: Nurse Practitioner

## 2021-10-23 ENCOUNTER — Other Ambulatory Visit: Payer: Self-pay | Admitting: Nurse Practitioner

## 2021-10-23 DIAGNOSIS — J018 Other acute sinusitis: Secondary | ICD-10-CM

## 2021-10-23 MED ORDER — AZITHROMYCIN 250 MG PO TABS: ORAL_TABLET | ORAL | 0 refills | Status: AC

## 2021-10-23 NOTE — Telephone Encounter (Signed)
Pt's spouse called office, stated he is experiencing sinus pressure/pain and tenderness. Pt had positive COVID-19 test on 10/21/21.

## 2021-11-26 NOTE — Progress Notes (Signed)
? ?Subjective:  ?Patient ID: Brett Norman, male    DOB: 07/23/1976  Age: 46 y.o. MRN: [id number] ? ?Chief Complaint  ?Patient presents with  ? Weight Management  ? ? ?HPI ?  ?Brett Norman is a 46 year old Caucasian male that presents for follow-up of Vitamin D deficiency, low testosterone, and weight management. States he has chronic left knee pain after an work injury approximately 78-months- ago. ? ?Hypogonadism ?[name] was found to have low testosterone in 2022. He was treated with testosterone replacement injections. Followed up with Urology 09/22/2021- was advised to stop testosterone replacement and return in 1 year with labs. He has requested to resume testosterone due to chronic fatigue and decreased libido that is interfering with quality of life. He is fasting for labs this morning.   ? ?Vitamin D deficiency, follow-up ? ?Lab Results  ?Component Value Date  ? VD25OH 21.8 (L) 04/21/2021  ? CALCIUM 9.5 05/23/2021  ? CALCIUM 8.7 04/21/2021  ? ?Wt Readings from Last 3 Encounters:  ?11/28/21 171 lb (77.6 kg)  ?08/27/21 178 lb 9.6 oz (81 kg)  ?08/12/21 178 lb (80.7 kg)  ? ? ?He was last seen for vitamin D deficiency 6 months ago.  ?Management includes Vit D 50, 000 U weekly ?He reports excellent compliance with treatment. ?He is not having side effects.  ? ?Symptoms: ?No change in energy level No numbness or tingling  ?No bone pain No unexplained fracture  ? ?---------------------------------------------------------------------------------------------------  ?Weight management, follow-up: ? ?[name] states he wants to lose weight to improve overall health. Current weight 171 lbs, BMI 27.60. He was prescribed Adipex 37.5 mg daily on 04/21/21. Initial weight prior to medication was 191 lbs, BMI 29.91. He has modified  He states he initially lost weight and had decreased fatigue. Denies side effects of medication. ?  ? ? ?Current Outpatient Medications on File Prior to Visit  ?Medication Sig Dispense Refill  ? benzonatate  (TESSALON) 100 MG capsule Take 1 capsule (100 mg total) by mouth 2 (two) times daily as needed for cough. 20 capsule 0  ? Chlorphen-PE-Acetaminophen (NOREL AD) 4-10-325 MG TABS 1 po q 6 hours prn congestion 12 tablet 0  ? ergocalciferol (VITAMIN D2) 1.25 MG (50000 UT) capsule Take 1 capsule (50,000 Units total) by mouth once a week. 12 capsule 1  ? fluticasone (FLONASE) 50 MCG/ACT nasal spray Place 2 sprays into both nostrils daily. 16 g 6  ? phentermine 37.5 MG capsule Take 1 capsule (37.5 mg total) by mouth every morning. 30 capsule 0  ? ?No current facility-administered medications on file prior to visit.  ? ?Past Medical History:  ?Diagnosis Date  ? Kidney stone   ? ?Past Surgical History:  ?Procedure Laterality Date  ? HERNIA REPAIR    ? age 57  ? KNEE ARTHROSCOPY    ? 1996, 1998   ? SHOULDER ARTHROSCOPY W/ ROTATOR CUFF REPAIR Left   ? TONSILLECTOMY    ? age 51   ?  ?Family History  ?Problem Relation Age of Onset  ? Breast cancer Mother   ? Osteoarthritis Father   ? ?Social History  ? ?Socioeconomic History  ? Marital status: Legally Separated  ?  Spouse name: Not on file  ? Number of children: 3  ? Years of education: Not on file  ? Highest education level: High school graduate  ?Occupational History  ? Not on file  ?Tobacco Use  ? Smoking status: Never  ? Smokeless tobacco: Never  ?Vaping Use  ? Vaping Use: Never used  ?  Substance and Sexual Activity  ? Alcohol use: Never  ? Drug use: Never  ? Sexual activity: Yes  ?Other Topics Concern  ? Not on file  ?Social History Narrative  ? Lives with son and his mother  ? Right handed  ? Caffeine: 2-3 sodas a day  ? ?Social Determinants of Health  ? ?Financial Resource Strain: Not on file  ?Food Insecurity: Not on file  ?Transportation Needs: Not on file  ?Physical Activity: Not on file  ?Stress: Not on file  ?Social Connections: Not on file  ? ? ?Review of Systems  ?Constitutional:  Positive for fatigue. Negative for chills, diaphoresis and fever.  ?HENT:  Negative for  congestion, ear pain and sore throat.   ?Respiratory:  Negative for cough and shortness of breath.   ?Cardiovascular:  Negative for chest pain and leg swelling.  ?Gastrointestinal:  Negative for abdominal pain, constipation, diarrhea, nausea and vomiting.  ?Genitourinary:  Negative for dysuria and urgency.  ?Musculoskeletal:  Positive for arthralgias (left knee pain). Negative for myalgias.  ?Neurological:  Positive for numbness (right hand, intermittent). Negative for dizziness and headaches.  ?Psychiatric/Behavioral:  Negative for dysphoric mood.   ? ? ?Objective:  ?BP 130/64   Pulse 88   Temp (!) 96.1 ?F (35.6 ?C)   Resp 16   Ht 5\' 6"  (1.676 m)   Wt 171 lb (77.6 kg)   BMI 27.60 kg/m?   ? ? ?  08/27/2021  ?  9:46 AM 08/12/2021  ? 10:43 AM 04/21/2021  ?  8:27 AM  ?BP/Weight  ?Systolic BP 118 122 122  ?Diastolic BP 78 78 72  ?Wt. (Lbs) 178.6 178 191  ?BMI 28.83 kg/m2 27.88 kg/m2 29.91 kg/m2  ? ? ?Physical Exam ?Vitals reviewed.  ?Constitutional:   ?   Appearance: Normal appearance.  ?HENT:  ?   Head: Normocephalic.  ?   Right Ear: Tympanic membrane normal.  ?   Left Ear: Tympanic membrane normal.  ?   Nose: Nose normal.  ?   Mouth/Throat:  ?   Mouth: Mucous membranes are moist.  ?Eyes:  ?   Pupils: Pupils are equal, round, and reactive to light.  ?Cardiovascular:  ?   Rate and Rhythm: Normal rate and regular rhythm.  ?   Pulses: Normal pulses.  ?   Heart sounds: Normal heart sounds.  ?Pulmonary:  ?   Effort: Pulmonary effort is normal.  ?   Breath sounds: Normal breath sounds.  ?Abdominal:  ?   General: Bowel sounds are normal.  ?   Palpations: Abdomen is soft.  ?Musculoskeletal:     ?   General: Tenderness (left knee) present. Normal range of motion.  ?Skin: ?   General: Skin is warm and dry.  ?   Capillary Refill: Capillary refill takes less than 2 seconds.  ?Neurological:  ?   General: No focal deficit present.  ?   Mental Status: He is alert and oriented to person, place, and time.  ?Psychiatric:     ?    Mood and Affect: Mood normal.     ?   Behavior: Behavior normal.  ? ? ? ?  ? ?Lab Results  ?Component Value Date  ? WBC 8.0 05/23/2021  ? HGB 17.0 05/23/2021  ? HCT 50.7 05/23/2021  ? PLT 223 05/23/2021  ? GLUCOSE 101 (H) 05/23/2021  ? ALT 35 05/23/2021  ? AST 18 05/23/2021  ? NA 145 (H) 05/23/2021  ? K 4.4 05/23/2021  ? CL 99 05/23/2021  ?  CREATININE 1.30 (H) 05/23/2021  ? BUN 15 05/23/2021  ? CO2 24 05/23/2021  ? TSH 1.500 12/16/2020  ? ? ? ? ?Assessment & Plan:  ? ? ?1. Low testosterone ?- CBC with Differential/Platelet ?- Comprehensive metabolic panel ?- Testosterone,Free and Total ? ?2. Low vitamin D level ?- Vitamin D, 25-hydroxy ? ?3. Chronic pain of left knee ?- DG Knee Complete 4 Views Left; Future ? ?4. BMI 27.0-27.9,adult ?- CBC with Differential/Platelet ?- Comprehensive metabolic panel ?- Lipid panel ?- Vitamin D, 25-hydroxy ?  ? ?We will call you with lab results and left knee x-ray ?Continue medications ?Follow-up 63-months ? ?  ? ?Follow-up: 32-months ? ?An After Visit Summary was printed and given to the patient. ? ?I, Janie Morning, NP, have reviewed all documentation for this visit. The documentation on 11/28/21 for the exam, diagnosis, procedures, and orders are all accurate and complete.  ? ? ?Signed, ?Janie Morning, NP ?[facility] ?[phone] ?

## 2021-11-28 ENCOUNTER — Ambulatory Visit: Payer: BC Managed Care – PPO | Admitting: Nurse Practitioner

## 2021-11-28 ENCOUNTER — Encounter: Payer: Self-pay | Admitting: Nurse Practitioner

## 2021-11-28 ENCOUNTER — Other Ambulatory Visit: Payer: Self-pay | Admitting: Nurse Practitioner

## 2021-11-28 VITALS — BP 130/64 | HR 88 | Temp 96.1°F | Resp 16 | Ht 66.0 in | Wt 171.0 lb

## 2021-11-28 DIAGNOSIS — M25562 Pain in left knee: Secondary | ICD-10-CM | POA: Diagnosis not present

## 2021-11-28 DIAGNOSIS — G8929 Other chronic pain: Secondary | ICD-10-CM

## 2021-11-28 DIAGNOSIS — Z6827 Body mass index (BMI) 27.0-27.9, adult: Secondary | ICD-10-CM | POA: Diagnosis not present

## 2021-11-28 DIAGNOSIS — Z6829 Body mass index (BMI) 29.0-29.9, adult: Secondary | ICD-10-CM

## 2021-11-28 DIAGNOSIS — E559 Vitamin D deficiency, unspecified: Secondary | ICD-10-CM

## 2021-11-28 DIAGNOSIS — R7989 Other specified abnormal findings of blood chemistry: Secondary | ICD-10-CM | POA: Diagnosis not present

## 2021-11-28 DIAGNOSIS — E663 Overweight: Secondary | ICD-10-CM

## 2021-11-28 MED ORDER — PHENTERMINE HCL 37.5 MG PO CAPS: 37.5000 mg | ORAL_CAPSULE | ORAL | 0 refills | Status: DC

## 2021-11-28 MED ORDER — ERGOCALCIFEROL 1.25 MG (50000 UT) PO CAPS: 50000.0000 [IU] | ORAL_CAPSULE | ORAL | 1 refills | Status: DC

## 2021-11-28 NOTE — Patient Instructions (Signed)
We will call you with lab results and left knee x-ray ?Continue medications ?Follow-up 46-months ? ?Hypogonadism, Male ?Male hypogonadism is a condition of having a level of testosterone that is lower than normal. Testosterone is a chemical, or hormone, that is made mainly in the testicles. ?In boys, testosterone is responsible for the development of male characteristics during puberty. These include: ?Making the penis bigger. ?Growing and building the muscles. ?Growing facial hair. ?Deepening the voice. ?In adult men, testosterone is responsible for maintaining: ?An interest in sex and the ability to have sex. ?Muscle mass. ?Sperm production. ?Red blood cell production. ?Bone strength. ?Testosterone also gives men energy and a sense of well-being. ?Testosterone normally decreases as men age and the testicles make less testosterone. Testosterone levels can vary from man to man. Not all men will have signs and symptoms of low testosterone. Weight, alcohol use, medicines, and certain medical conditions can affect a man's testosterone level. ?What are the causes? ?This condition is caused by: ?A natural decrease in testosterone that occurs as a man grows older. This is the main cause of this condition. ?Use of medicines, such as antidepressants, steroids, and opioids. ?Diseases and conditions that affect the testicles or the making of testosterone. These include: ?Injury or damage to the testicles from trauma, cancer, cancer treatment, or infection. ?Diabetes. ?Sleep apnea. ?Genetic conditions that men are born with. ?Disease of the pituitary gland. This gland is in the brain. It produces hormones. ?Obesity. ?Metabolic syndrome. This is a group of diseases that affect blood pressure, blood sugar, cholesterol, and belly fat. ?HIV or AIDS. ?Alcohol abuse. ?Kidney failure. ?Other long-term or chronic diseases. ?What are the signs or symptoms? ?Common symptoms of this condition include: ?Loss of interest in sex (low sex  drive). ?Inability to have or maintain an erection (erectile dysfunction). ?Feeling tired (fatigue). ?Mood changes, like irritability or depression. ?Loss of muscle and body hair. ?Infertility. ?Large breasts. ?Weight gain (obesity). ?How is this diagnosed? ?Your health care provider can diagnose hypogonadism based on: ?Your signs and symptoms. ?A physical exam to check your testosterone levels. This includes blood tests. Testosterone levels can change throughout the day. Levels are highest in the morning. You may need to have repeat blood tests before getting a diagnosis of hypogonadism. ?Depending on your medical history and test results, your health care provider may also do other tests to find the cause of low testosterone. ?How is this treated? ?This condition is treated with testosterone replacement therapy. Testosterone can be given by: ?Injection or through pellets inserted under the skin. ?Gels or patches placed on the skin or in the mouth. ?Testosterone therapy is not for everyone. It has risks and side effects. Your health care provider will consider your medical history, your risk for prostate cancer, your age, and your symptoms before putting you on testosterone replacement therapy. ?Follow these instructions at home: ?Take over-the-counter and prescription medicines only as told by your health care provider. ?Eat foods that are high in fiber, such as beans, whole grains, and fresh fruits and vegetables. Limit foods that are high in fat and processed sugars, such as fried or sweet foods. ?If you drink alcohol: ?Limit how much you have to 0-2 drinks a day. ?Know how much alcohol is in your drink. In the [place] one drink equals one 12 oz bottle of beer (355 mL), one 5 oz glass of wine (148 mL), or one 1? oz glass of hard liquor (44 mL). ?Return to your normal activities as told by your health  care provider. Ask your health care provider what activities are safe for you. ?Keep all follow-up visits. This is  important. ?Contact a health care provider if: ?You have any of the signs or symptoms of low testosterone. ?You have any side effects from testosterone therapy. ?Summary ?Male hypogonadism is a condition of having a level of testosterone that is lower than normal. ?The natural drop in testosterone production that occurs with age is the most common cause of this condition. ?Low testosterone can also be caused by many diseases and conditions that affect the testicles and the making of testosterone. ?This condition is treated with testosterone replacement therapy. ?There are risks and side effects of testosterone therapy. Your health care provider will consider your age, medical history, symptoms, and risks for prostate cancer before putting you on testosterone therapy. ?This information is not intended to replace advice given to you by your health care provider. Make sure you discuss any questions you have with your health care provider. ?Document Revised: 04/25/2020 Document Reviewed: 04/25/2020 ?Elsevier Patient Education ? 2022 [employer] ? ?

## 2021-12-04 LAB — LIPID PANEL
Chol/HDL Ratio: 2.6 ratio (ref 0.0–5.0)
Cholesterol, Total: 146 mg/dL (ref 100–199)
HDL: 56 mg/dL (ref >39)
LDL Chol Calc (NIH): 75 mg/dL (ref 0–99)
Triglycerides: 77 mg/dL (ref 0–149)
VLDL Cholesterol Cal: 15 mg/dL (ref 5–40)

## 2021-12-04 LAB — CBC WITH DIFFERENTIAL/PLATELET
Basophils Absolute: 0 10*3/uL (ref 0.0–0.2)
Basos: 0 %
EOS (ABSOLUTE): 0.1 10*3/uL (ref 0.0–0.4)
Eos: 1 %
Hematocrit: 49.7 % (ref 37.5–51.0)
Hemoglobin: 17.5 g/dL (ref 13.0–17.7)
Immature Grans (Abs): 0 10*3/uL (ref 0.0–0.1)
Immature Granulocytes: 0 %
Lymphocytes Absolute: 2.7 10*3/uL (ref 0.7–3.1)
Lymphs: 35 %
MCH: 31.3 pg (ref 26.6–33.0)
MCHC: 35.2 g/dL (ref 31.5–35.7)
MCV: 89 fL (ref 79–97)
Monocytes Absolute: 0.4 10*3/uL (ref 0.1–0.9)
Monocytes: 5 %
Neutrophils Absolute: 4.4 10*3/uL (ref 1.4–7.0)
Neutrophils: 59 %
Platelets: 244 10*3/uL (ref 150–450)
RBC: 5.59 x10E6/uL (ref 4.14–5.80)
RDW: 12.4 % (ref 11.6–15.4)
WBC: 7.6 10*3/uL (ref 3.4–10.8)

## 2021-12-04 LAB — COMPREHENSIVE METABOLIC PANEL
ALT: 36 IU/L (ref 0–44)
AST: 21 IU/L (ref 0–40)
Albumin/Globulin Ratio: 2.4 — ABNORMAL HIGH (ref 1.2–2.2)
Albumin: 4.6 g/dL (ref 4.0–5.0)
Alkaline Phosphatase: 105 IU/L (ref 44–121)
BUN/Creatinine Ratio: 11 (ref 9–20)
BUN: 14 mg/dL (ref 6–24)
Bilirubin Total: 0.9 mg/dL (ref 0.0–1.2)
CO2: 27 mmol/L (ref 20–29)
Calcium: 9.5 mg/dL (ref 8.7–10.2)
Chloride: 99 mmol/L (ref 96–106)
Creatinine, Ser: 1.22 mg/dL (ref 0.76–1.27)
Globulin, Total: 1.9 g/dL (ref 1.5–4.5)
Glucose: 90 mg/dL (ref 70–99)
Potassium: 4.1 mmol/L (ref 3.5–5.2)
Sodium: 139 mmol/L (ref 134–144)
Total Protein: 6.5 g/dL (ref 6.0–8.5)
eGFR: 75 mL/min/{1.73_m2} (ref >59)

## 2021-12-04 LAB — TESTOSTERONE,FREE AND TOTAL
Testosterone, Free: 6.6 pg/mL — ABNORMAL LOW (ref 6.8–21.5)
Testosterone: 354 ng/dL (ref 264–916)

## 2021-12-04 LAB — VITAMIN D 25 HYDROXY (VIT D DEFICIENCY, FRACTURES): Vit D, 25-Hydroxy: 63.4 ng/mL (ref 30.0–100.0)

## 2021-12-04 LAB — CARDIOVASCULAR RISK ASSESSMENT

## 2021-12-16 ENCOUNTER — Other Ambulatory Visit: Payer: Self-pay

## 2021-12-16 DIAGNOSIS — G8929 Other chronic pain: Secondary | ICD-10-CM

## 2022-01-06 DIAGNOSIS — M25562 Pain in left knee: Secondary | ICD-10-CM | POA: Diagnosis not present

## 2022-01-14 ENCOUNTER — Other Ambulatory Visit: Payer: Self-pay | Admitting: Nurse Practitioner

## 2022-01-14 DIAGNOSIS — E663 Overweight: Secondary | ICD-10-CM

## 2022-01-14 DIAGNOSIS — E559 Vitamin D deficiency, unspecified: Secondary | ICD-10-CM

## 2022-01-14 DIAGNOSIS — Z6829 Body mass index (BMI) 29.0-29.9, adult: Secondary | ICD-10-CM

## 2022-01-14 MED ORDER — PHENTERMINE HCL 37.5 MG PO CAPS: 37.5000 mg | ORAL_CAPSULE | ORAL | 0 refills | Status: DC

## 2022-01-14 MED ORDER — ERGOCALCIFEROL 1.25 MG (50000 UT) PO CAPS: 50000.0000 [IU] | ORAL_CAPSULE | ORAL | 1 refills | Status: DC

## 2022-03-04 ENCOUNTER — Ambulatory Visit: Payer: BC Managed Care – PPO | Admitting: Nurse Practitioner

## 2022-03-04 ENCOUNTER — Encounter: Payer: Self-pay | Admitting: Nurse Practitioner

## 2022-03-04 VITALS — BP 110/76 | HR 101 | Temp 97.1°F | Ht 66.0 in | Wt 169.0 lb

## 2022-03-04 DIAGNOSIS — Z6827 Body mass index (BMI) 27.0-27.9, adult: Secondary | ICD-10-CM | POA: Diagnosis not present

## 2022-03-04 DIAGNOSIS — G4709 Other insomnia: Secondary | ICD-10-CM

## 2022-03-04 DIAGNOSIS — Z7689 Persons encountering health services in other specified circumstances: Secondary | ICD-10-CM

## 2022-03-04 DIAGNOSIS — E559 Vitamin D deficiency, unspecified: Secondary | ICD-10-CM | POA: Diagnosis not present

## 2022-03-04 NOTE — Progress Notes (Signed)
   Established Patient Office Visit  Subjective   Patient ID: Brett Norman, male    DOB: 03-31-1976  Age: 46 y.o. MRN: [id number]  CC Vit D deficiency Weight management  HPI  Brett Norman presents for follow-up of Vit D deficiency and weight management. Reports chronic left knee pain. Was seen by ortho, Dr Durwin Nora, [facility] who did not see cause for pain on imaging. Recommended he return if pain worsens.   Vitamin D deficiency, follow-up  Lab Results  Component Value Date   VD25OH 63.4 11/28/2021   VD25OH 21.8 (L) 04/21/2021   CALCIUM 9.5 11/28/2021   CALCIUM 9.5 05/23/2021   Wt Readings from Last 3 Encounters:  03/04/22 169 lb (76.7 kg)  11/28/21 171 lb (77.6 kg)  08/27/21 178 lb 9.6 oz (81 kg)    He was last seen for vitamin D deficiency 3 months ago.  Management since that visit includes Vit D 50, 000 U weekly. He reports excellent compliance with treatment. He is not having side effects.   Weight management, f/u: Pt is currently prescribed Adipex 37.5 mg. Current weight 169 lbs, BMI 27.28. He denies any side effects of medication. He has modified his diet and increased physical activity. Pt has lost 3 pounds since last visit 35-months ago, total of 9 pounds   Review of Systems  Constitutional:  Positive for malaise/fatigue.  Psychiatric/Behavioral:  The patient has insomnia.        Objective:     BP 110/76   Pulse (!) 101   Temp (!) 97.1 F (36.2 C)   Ht 5\' 6"  (1.676 m)   Wt 169 lb (76.7 kg)   SpO2 97%   BMI 27.28 kg/m     Physical Exam Constitutional:      Appearance: Normal appearance.  Neck:     Vascular: No carotid bruit.  Cardiovascular:     Rate and Rhythm: Regular rhythm. Tachycardia present.     Heart sounds: Normal heart sounds. No murmur heard. Pulmonary:     Effort: Pulmonary effort is normal.     Breath sounds: Normal breath sounds.  Abdominal:     General: Bowel sounds are normal.     Palpations: Abdomen is soft. There is no mass.      Tenderness: There is no abdominal tenderness.  Musculoskeletal:        General: No swelling.  Neurological:     Mental Status: He is alert and oriented to person, place, and time.  Psychiatric:        Mood and Affect: Mood normal.        Behavior: Behavior normal.           Assessment & Plan:    1. Vitamin D deficiency-well controlled -continue vit D 50, 000 U weekly -continue vit D rich diet  2. Encounter for weight management -continue heart healthy diet -continue regular physical activity  3. Other insomnia -avoid caffeine -practice sleep hygiene  4. BMI 27.0-27.9,adult   Continue medications Follow-up in 1-months   Follow-up: 73-months  I, 2-month, NP, have reviewed all documentation for this visit. The documentation on 03/04/22 for the exam, diagnosis, procedures, and orders are all accurate and complete.   Continue medications Follow-up in 41-months  Signed, 2-month, NP

## 2022-03-04 NOTE — Patient Instructions (Signed)
Continue medications Follow-up in 65-months   Quality Sleep Information, Adult Quality sleep is important for your mental and physical health. It also improves your quality of life. Quality sleep means you: Are asleep for most of the time you are in bed. Fall asleep within 30 minutes. Wake up no more than once a night.  Are awake for no longer than 20 minutes if you do wake up during the night. Most adults need 7-8 hours of quality sleep each night. How can poor sleep affect me? If you do not get enough quality sleep, you may have: Mood swings. Daytime sleepiness. Confusion. Decreased reaction time. Sleep disorders, such as insomnia and sleep apnea. Difficulty with: Solving problems. Coping with stress. Paying attention. These issues may affect your performance and productivity at work, school, and at home. Lack of sleep may also put you at higher risk for accidents, suicide, and risky behaviors. If you do not get quality sleep you may also be at higher risk for several health problems, including: Infections. Type 2 diabetes. Heart disease. High blood pressure. Obesity. Worsening of long-term conditions, like arthritis, kidney disease, depression, Parkinson's disease, and epilepsy. What actions can I take to get more quality sleep?     Stick to a sleep schedule. Go to sleep and wake up at about the same time each day. Do not try to sleep less on weekdays and make up for lost sleep on weekends. This does not work. Try to get about 30 minutes of exercise on most days. Do not exercise 2-3 hours before going to bed. Limit naps during the day to 30 minutes or less. Do not use any products that contain nicotine or tobacco, such as cigarettes or e-cigarettes. If you need help quitting, ask your health care provider. Do not drink caffeinated beverages for at least 8 hours before going to bed. Coffee, tea, and some sodas contain caffeine. Do not drink alcohol close to bedtime. Do not  eat large meals close to bedtime. Do not take naps in the late afternoon. Try to get at least 30 minutes of sunlight every day. Morning sunlight is best. Make time to relax before bed. Reading, listening to music, or taking a hot bath promotes quality sleep. Make your bedroom a place that promotes quality sleep. Keep your bedroom dark, quiet, and at a comfortable room temperature. Make sure your bed is comfortable. Take out sleep distractions like TV, a computer, smartphone, and bright lights. If you are lying awake in bed for longer than 20 minutes, get up and do a relaxing activity until you feel sleepy. Work with your health care provider to treat medical conditions that may affect sleeping, such as: Nasal obstruction. Snoring. Sleep apnea and other sleep disorders. Talk to your health care provider if you think any of your prescription medicines may cause you to have difficulty falling or staying asleep. If you have sleep problems, talk with a sleep consultant. If you think you have a sleep disorder, talk with your health care provider about getting evaluated by a specialist. Where to find more information National Sleep Foundation website: https://sleepfoundation.org National Heart, Lung, and Blood Institute (NHLBI): https://hall.info/.pdf Centers for Disease Control and Prevention (CDC): [employer]ail[employer].is Contact a health care provider if you: Have trouble getting to sleep or staying asleep. Often wake up very early in the morning and cannot get back to sleep. Have daytime sleepiness. Have daytime sleep attacks of suddenly falling asleep and sudden muscle weakness (narcolepsy). Have a tingling sensation in your legs  with a strong urge to move your legs (restless legs syndrome). Stop breathing briefly during sleep (sleep apnea). Think you have a sleep disorder or are taking a medicine that is affecting your quality of sleep. Summary Most  adults need 7-8 hours of quality sleep each night. Getting enough quality sleep is an important part of health and well-being. Make your bedroom a place that promotes quality sleep and avoid things that may cause you to have poor sleep, such as alcohol, caffeine, smoking, and large meals. Talk to your health care provider if you have trouble falling asleep or staying asleep. This information is not intended to replace advice given to you by your health care provider. Make sure you discuss any questions you have with your health care provider. Document Revised: 09/29/2021 Document Reviewed: 06/23/2021 Elsevier Patient Education  [place].   Vitamin D Deficiency Vitamin D deficiency is when your body does not have enough vitamin D. Vitamin D is important because: It helps your body use certain minerals. It helps to keep your bones healthy. It lessens irritation and swelling (inflammation). It helps the body's defense system (immune system) work better. Not getting enough vitamin D can make your bones soft. What are the causes? Not eating enough foods that have vitamin D in them. Not getting enough sun. Having diseases that make it hard for your body to take in vitamin D. Having had part of your stomach or part of your small intestine taken out. What increases the risk? Being an older adult. Not spending much time outdoors. Living in a long-term care center. Having dark skin. Taking certain medicines. Being overweight or very overweight (obese). Having long-term (chronic) kidney or liver disease. What are the signs or symptoms? In mild cases, there may be no symptoms. If the condition is very bad, symptoms may include: Bone pain. Muscle pain. Not being able to walk normally. Bones that break easily. Joint pain. How is this treated? Treatment may include taking supplements as told by your doctor. Your doctor will tell you what dose is best for you. This may include  taking: Vitamin D. Calcium. Follow these instructions at home: Eating and drinking Eat foods that have vitamin D in them, such as: Dairy products, cereals, or juices that have vitamin D added to them (are fortified). Check the label. Fish, such as salmon or trout. Eggs. The vitamin D is in the yolk. Mushrooms that were treated with UV light. Beef liver. The items listed above may not be a complete list of foods and beverages you can eat and drink. Contact a dietitian for more information. General instructions Take over-the-counter and prescription medicines only as told by your doctor. Take supplements only as told by your doctor. Get sunlight in a safe way. Do not use a tanning bed. Stay at a healthy weight. Lose weight if you need to. Keep all follow-up visits. How is this prevented? Eating foods that naturally have vitamin D in them. Eating or drinking foods and drinks that have vitamin D added to them, such as cereals, juices, and milk. Taking vitamin D or a multivitamin that has vitamin D in it. Being in the sun. Your body makes vitamin D when your skin gets sunlight. Contact a doctor if: Your symptoms do not go away. You feel like you may vomit (nauseous). You vomit. You poop less often than normal, or you have trouble pooping (constipation). Summary Vitamin D deficiency is when your body does not have enough vitamin D. Vitamin D  helps to keep your bones healthy. This condition is often treated by taking a supplement. Your doctor will tell you what dose is best for you. This information is not intended to replace advice given to you by your health care provider. Make sure you discuss any questions you have with your health care provider. Document Revised: 05/30/2021 Document Reviewed: 05/30/2021 Elsevier Patient Education  2023 [employer].

## 2022-03-05 ENCOUNTER — Ambulatory Visit: Payer: BC Managed Care – PPO | Admitting: Nurse Practitioner

## 2022-03-12 ENCOUNTER — Other Ambulatory Visit: Payer: Self-pay

## 2022-03-12 DIAGNOSIS — Z6829 Body mass index (BMI) 29.0-29.9, adult: Secondary | ICD-10-CM

## 2022-03-12 DIAGNOSIS — E663 Overweight: Secondary | ICD-10-CM

## 2022-03-12 MED ORDER — PHENTERMINE HCL 37.5 MG PO CAPS: 37.5000 mg | ORAL_CAPSULE | ORAL | 0 refills | Status: DC

## 2022-05-15 ENCOUNTER — Other Ambulatory Visit: Payer: Self-pay

## 2022-05-15 DIAGNOSIS — E559 Vitamin D deficiency, unspecified: Secondary | ICD-10-CM

## 2022-05-15 DIAGNOSIS — E663 Overweight: Secondary | ICD-10-CM

## 2022-05-15 DIAGNOSIS — Z6829 Body mass index (BMI) 29.0-29.9, adult: Secondary | ICD-10-CM

## 2022-05-15 MED ORDER — PHENTERMINE HCL 37.5 MG PO CAPS: 37.5000 mg | ORAL_CAPSULE | ORAL | 0 refills | Status: DC

## 2022-05-15 MED ORDER — ERGOCALCIFEROL 1.25 MG (50000 UT) PO CAPS: 50000.0000 [IU] | ORAL_CAPSULE | ORAL | 1 refills | Status: DC

## 2022-05-16 ENCOUNTER — Telehealth: Payer: Self-pay | Admitting: Nurse Practitioner

## 2022-05-16 ENCOUNTER — Other Ambulatory Visit: Payer: Self-pay | Admitting: Nurse Practitioner

## 2022-05-16 DIAGNOSIS — K5792 Diverticulitis of intestine, part unspecified, without perforation or abscess without bleeding: Secondary | ICD-10-CM

## 2022-05-16 DIAGNOSIS — Z8719 Personal history of other diseases of the digestive system: Secondary | ICD-10-CM

## 2022-05-16 MED ORDER — AMOXICILLIN-POT CLAVULANATE 875-125 MG PO TABS: 1.0000 | ORAL_TABLET | Freq: Two times a day (BID) | ORAL | 0 refills | Status: DC

## 2022-05-16 NOTE — Telephone Encounter (Signed)
Pt's spouse called after hours call phone. States pt is experiencing severe LLQ pain. Colonoscopy report from 2022 reveals diverticulosis in ascending and descending colon. Encouraged bowel rest, clear liquid diet and advance as tolerated. Seek emergency medical care if symptoms become severe or concerning. Prescription for Augmentin BID x 10 days sent to [facility] in [place]. Spouse verbalized understanding.

## 2022-06-08 ENCOUNTER — Ambulatory Visit: Payer: BC Managed Care – PPO | Admitting: Nurse Practitioner

## 2022-06-08 ENCOUNTER — Encounter: Payer: Self-pay | Admitting: Nurse Practitioner

## 2022-06-08 VITALS — BP 120/76 | HR 104 | Temp 97.2°F | Ht 65.0 in | Wt 167.0 lb

## 2022-06-08 DIAGNOSIS — Z6827 Body mass index (BMI) 27.0-27.9, adult: Secondary | ICD-10-CM | POA: Diagnosis not present

## 2022-06-08 DIAGNOSIS — Z7689 Persons encountering health services in other specified circumstances: Secondary | ICD-10-CM

## 2022-06-08 NOTE — Progress Notes (Signed)
   Established Patient Office Visit  Subjective   Patient ID: Brett Norman, male    DOB: 05-17-1976  Age: 46 y.o. MRN: [id number]  Chief Complaint  Patient presents with   Weight Gain    HPI  Pt presents for weight management.   Review of Systems  All other systems reviewed and are negative.     Objective:     BP 120/76 (BP Location: Left Arm, Patient Position: Sitting, Cuff Size: Normal)   Pulse (!) 104   Temp (!) 97.2 F (36.2 C) (Temporal)   Ht 5\' 5"  (1.651 m)   Wt 167 lb (75.8 kg)   SpO2 98%   BMI 27.79 kg/m    Physical Exam Vitals reviewed.  Constitutional:      Appearance: Normal appearance.  HENT:     Right Ear: Tympanic membrane normal.     Left Ear: Tympanic membrane normal.     Nose: Nose normal.     Mouth/Throat:     Mouth: Mucous membranes are moist.  Eyes:     Pupils: Pupils are equal, round, and reactive to light.  Cardiovascular:     Rate and Rhythm: Normal rate and regular rhythm.     Pulses: Normal pulses.     Heart sounds: Normal heart sounds.  Pulmonary:     Effort: Pulmonary effort is normal.     Breath sounds: Normal breath sounds.  Abdominal:     General: Bowel sounds are normal.     Palpations: Abdomen is soft.  Skin:    General: Skin is warm and dry.     Capillary Refill: Capillary refill takes less than 2 seconds.  Neurological:     General: No focal deficit present.     Mental Status: He is alert and oriented to person, place, and time.  Psychiatric:        Mood and Affect: Mood normal.        Behavior: Behavior normal.      Assessment & Plan:   1. Encounter for weight management -continue heart healthy diet -continue regular physical activity  2. BMI 27.0-27.9,adult    Continue heart healthy diet Continue regular physical exercise Follow-up in 6-months   Follow-up: 13-months  I, Rip Harbour, NP, have reviewed all documentation for this visit. The documentation on 06/08/22 for the exam, diagnosis,  procedures, and orders are all accurate and complete.   Signed,  Rip Harbour, NP 06/08/22 at 9:37

## 2022-06-08 NOTE — Patient Instructions (Signed)
Continue heart healthy diet Continue regular physical exercise Follow-up in 63-months   Exercising to Lose Weight Getting regular exercise is important for everyone. It is especially important if you are overweight. Being overweight increases your risk of heart disease, stroke, diabetes, high blood pressure, and several types of cancer. Exercising, and reducing the calories you consume, can help you lose weight and improve fitness and health. Exercise can be moderate or vigorous intensity. To lose weight, most people need to do a certain amount of moderate or vigorous-intensity exercise each week. How can exercise affect me? You lose weight when you exercise enough to burn more calories than you eat. Exercise also reduces body fat and builds muscle. The more muscle you have, the more calories you burn. Exercise also: Improves mood. Reduces stress and tension. Improves your overall fitness, flexibility, and endurance. Increases bone strength. Moderate-intensity exercise  Moderate-intensity exercise is any activity that gets you moving enough to burn at least three times more energy (calories) than if you were sitting. Examples of moderate exercise include: Walking a mile in 15 minutes. Doing light yard work. Biking at an easy pace. Most people should get at least 150 minutes of moderate-intensity exercise a week to maintain their body weight. Vigorous-intensity exercise Vigorous-intensity exercise is any activity that gets you moving enough to burn at least six times more calories than if you were sitting. When you exercise at this intensity, you should be working hard enough that you are not able to carry on a conversation. Examples of vigorous exercise include: Running. Playing a team sport, such as football, basketball, and soccer. Jumping rope. Most people should get at least 75 minutes a week of vigorous exercise to maintain their body weight. What actions can I take to lose  weight? The amount of exercise you need to lose weight depends on: Your age. The type of exercise. Any health conditions you have. Your overall physical ability. Talk to your health care provider about how much exercise you need and what types of activities are safe for you. Nutrition  Make changes to your diet as told by your health care provider or diet and nutrition specialist (dietitian). This may include: Eating fewer calories. Eating more protein. Eating less unhealthy fats. Eating a diet that includes fresh fruits and vegetables, whole grains, low-fat dairy products, and lean protein. Avoiding foods with added fat, salt, and sugar. Drink plenty of water while you exercise to prevent dehydration or heat stroke. Activity Choose an activity that you enjoy and set realistic goals. Your health care provider can help you make an exercise plan that works for you. Exercise at a moderate or vigorous intensity most days of the week. The intensity of exercise may vary from person to person. You can tell how intense a workout is for you by paying attention to your breathing and heartbeat. Most people will notice their breathing and heartbeat get faster with more intense exercise. Do resistance training twice each week, such as: Push-ups. Sit-ups. Lifting weights. Using resistance bands. Getting short amounts of exercise can be just as helpful as long, structured periods of exercise. If you have trouble finding time to exercise, try doing these things as part of your daily routine: Get up, stretch, and walk around every 30 minutes throughout the day. Go for a walk during your lunch break. Park your car farther away from your destination. If you take public transportation, get off one stop early and walk the rest of the way. Make phone calls while  standing up and walking around. Take the stairs instead of elevators or escalators. Wear comfortable clothes and shoes with good support. Do not  exercise so much that you hurt yourself, feel dizzy, or get very short of breath. Where to find more information [place] Department of Health and Human Services: ThisPath.fi Centers for Disease Control and Prevention: FootballExhibition.com.br Contact a health care provider: Before starting a new exercise program. If you have questions or concerns about your weight. If you have a medical problem that keeps you from exercising. Get help right away if: You have any of the following while exercising: Injury. Dizziness. Difficulty breathing or shortness of breath that does not go away when you stop exercising. Chest pain. Rapid heartbeat. These symptoms may represent a serious problem that is an emergency. Do not wait to see if the symptoms will go away. Get medical help right away. Call your local emergency services (911 in the [place] Do not drive yourself to the hospital. Summary Getting regular exercise is especially important if you are overweight. Being overweight increases your risk of heart disease, stroke, diabetes, high blood pressure, and several types of cancer. Losing weight happens when you burn more calories than you eat. Reducing the amount of calories you eat, and getting regular moderate or vigorous exercise each week, helps you lose weight. This information is not intended to replace advice given to you by your health care provider. Make sure you discuss any questions you have with your health care provider. Document Revised: 10/20/2020 Document Reviewed: 10/20/2020 Elsevier Patient Education  2023 Else[employer] Inc.   Calorie Counting for Brett Norman Loss Calories are units of energy. Your body needs a certain number of calories from food to keep going throughout the day. When you eat or drink more calories than your body needs, your body stores the extra calories mostly as fat. When you eat or drink fewer calories than your body needs, your body burns fat to get the energy it needs. Calorie  counting means keeping track of how many calories you eat and drink each day. Calorie counting can be helpful if you need to lose weight. If you eat fewer calories than your body needs, you should lose weight. Ask your health care provider what a healthy weight is for you. For calorie counting to work, you will need to eat the right number of calories each day to lose a healthy amount of weight per week. A dietitian can help you figure out how many calories you need in a day and will suggest ways to reach your calorie goal. A healthy amount of weight to lose each week is usually 1-2 lb (0.5-0.9 kg). This usually means that your daily calorie intake should be reduced by 500-750 calories. Eating 1,200-1,500 calories a day can help most women lose weight. Eating 1,500-1,800 calories a day can help most men lose weight. What do I need to know about calorie counting? Work with your health care provider or dietitian to determine how many calories you should get each day. To meet your daily calorie goal, you will need to: Find out how many calories are in each food that you would like to eat. Try to do this before you eat. Decide how much of the food you plan to eat. Keep a food log. Do this by writing down what you ate and how many calories it had. To successfully lose weight, it is important to balance calorie counting with a healthy lifestyle that includes regular activity. Where do I  find calorie information?  The number of calories in a food can be found on a Nutrition Facts label. If a food does not have a Nutrition Facts label, try to look up the calories online or ask your dietitian for help. Remember that calories are listed per serving. If you choose to have more than one serving of a food, you will have to multiply the calories per serving by the number of servings you plan to eat. For example, the label on a package of bread might say that a serving size is 1 slice and that there are 90 calories  in a serving. If you eat 1 slice, you will have eaten 90 calories. If you eat 2 slices, you will have eaten 180 calories. How do I keep a food log? After each time that you eat, record the following in your food log as soon as possible: What you ate. Be sure to include toppings, sauces, and other extras on the food. How much you ate. This can be measured in cups, ounces, or number of items. How many calories were in each food and drink. The total number of calories in the food you ate. Keep your food log near you, such as in a pocket-sized notebook or on an app or website on your mobile phone. Some programs will calculate calories for you and show you how many calories you have left to meet your daily goal. What are some portion-control tips? Know how many calories are in a serving. This will help you know how many servings you can have of a certain food. Use a measuring cup to measure serving sizes. You could also try weighing out portions on a kitchen scale. With time, you will be able to estimate serving sizes for some foods. Take time to put servings of different foods on your favorite plates or in your favorite bowls and cups so you know what a serving looks like. Try not to eat straight from a food's packaging, such as from a bag or box. Eating straight from the package makes it hard to see how much you are eating and can lead to overeating. Put the amount you would like to eat in a cup or on a plate to make sure you are eating the right portion. Use smaller plates, glasses, and bowls for smaller portions and to prevent overeating. Try not to multitask. For example, avoid watching TV or using your computer while eating. If it is time to eat, sit down at a table and enjoy your food. This will help you recognize when you are full. It will also help you be more mindful of what and how much you are eating. What are tips for following this plan? Reading food labels Check the calorie count compared  with the serving size. The serving size may be smaller than what you are used to eating. Check the source of the calories. Try to choose foods that are high in protein, fiber, and vitamins, and low in saturated fat, trans fat, and sodium. Shopping Read nutrition labels while you shop. This will help you make healthy decisions about which foods to buy. Pay attention to nutrition labels for low-fat or fat-free foods. These foods sometimes have the same number of calories or more calories than the full-fat versions. They also often have added sugar, starch, or salt to make up for flavor that was removed with the fat. Make a grocery list of lower-calorie foods and stick to it. Cooking Try to [employer]  your favorite foods in a healthier way. For example, try baking instead of frying. Use low-fat dairy products. Meal planning Use more fruits and vegetables. One-half of your plate should be fruits and vegetables. Include lean proteins, such as chicken, [place], and fish. Lifestyle Each week, aim to do one of the following: 150 minutes of moderate exercise, such as walking. 75 minutes of vigorous exercise, such as running. General information Know how many calories are in the foods you eat most often. This will help you calculate calorie counts faster. Find a way of tracking calories that works for you. Get creative. Try different apps or programs if writing down calories does not work for you. What foods should I eat?  Eat nutritious foods. It is better to have a nutritious, high-calorie food, such as an avocado, than a food with few nutrients, such as a bag of potato chips. Use your calories on foods and drinks that will fill you up and will not leave you hungry soon after eating. Examples of foods that fill you up are nuts and nut butters, vegetables, lean proteins, and high-fiber foods such as whole grains. High-fiber foods are foods with more than 5 g of fiber per serving. Pay attention to calories in  drinks. Low-calorie drinks include water and unsweetened drinks. The items listed above may not be a complete list of foods and beverages you can eat. Contact a dietitian for more information. What foods should I limit? Limit foods or drinks that are not good sources of vitamins, minerals, or protein or that are high in unhealthy fats. These include: Candy. Other sweets. Sodas, specialty coffee drinks, alcohol, and juice. The items listed above may not be a complete list of foods and beverages you should avoid. Contact a dietitian for more information. How do I count calories when eating out? Pay attention to portions. Often, portions are much larger when eating out. Try these tips to keep portions smaller: Consider sharing a meal instead of getting your own. If you get your own meal, eat only half of it. Before you start eating, ask for a container and put half of your meal into it. When available, consider ordering smaller portions from the menu instead of full portions. Pay attention to your food and drink choices. Knowing the way food is cooked and what is included with the meal can help you eat fewer calories. If calories are listed on the menu, choose the lower-calorie options. Choose dishes that include vegetables, fruits, whole grains, low-fat dairy products, and lean proteins. Choose items that are boiled, broiled, grilled, or steamed. Avoid items that are buttered, battered, fried, or served with cream sauce. Items labeled as crispy are usually fried, unless stated otherwise. Choose water, low-fat milk, unsweetened iced tea, or other drinks without added sugar. If you want an alcoholic beverage, choose a lower-calorie option, such as a glass of wine or light beer. Ask for dressings, sauces, and syrups on the side. These are usually high in calories, so you should limit the amount you eat. If you want a salad, choose a garden salad and ask for grilled meats. Avoid extra toppings such as  bacon, cheese, or fried items. Ask for the dressing on the side, or ask for olive oil and vinegar or lemon to use as dressing. Estimate how many servings of a food you are given. Knowing serving sizes will help you be aware of how much food you are eating at restaurants. Where to find more information Centers for Disease  Control and Prevention: http://www.wolf.info/ [place] Department of Agriculture: http://www.wilson-mendoza.org/ Summary Calorie counting means keeping track of how many calories you eat and drink each day. If you eat fewer calories than your body needs, you should lose weight. A healthy amount of weight to lose per week is usually 1-2 lb (0.5-0.9 kg). This usually means reducing your daily calorie intake by 500-750 calories. The number of calories in a food can be found on a Nutrition Facts label. If a food does not have a Nutrition Facts label, try to look up the calories online or ask your dietitian for help. Use smaller plates, glasses, and bowls for smaller portions and to prevent overeating. Use your calories on foods and drinks that will fill you up and not leave you hungry shortly after a meal. This information is not intended to replace advice given to you by your health care provider. Make sure you discuss any questions you have with your health care provider. Document Revised: 10/05/2019 Document Reviewed: 10/05/2019 Elsevier Patient Education  Brett Norman.

## 2022-07-03 ENCOUNTER — Other Ambulatory Visit: Payer: Self-pay | Admitting: Nurse Practitioner

## 2022-07-03 DIAGNOSIS — Z6829 Body mass index (BMI) 29.0-29.9, adult: Secondary | ICD-10-CM

## 2022-07-03 DIAGNOSIS — E663 Overweight: Secondary | ICD-10-CM

## 2022-07-03 MED ORDER — PHENTERMINE HCL 37.5 MG PO CAPS: 37.5000 mg | ORAL_CAPSULE | ORAL | 0 refills | Status: DC

## 2022-08-03 ENCOUNTER — Encounter: Payer: Self-pay | Admitting: Physician Assistant

## 2022-08-03 ENCOUNTER — Ambulatory Visit: Payer: BC Managed Care – PPO | Admitting: Physician Assistant

## 2022-08-03 VITALS — BP 130/82 | HR 106 | Temp 97.8°F | Ht 66.0 in | Wt 166.4 lb

## 2022-08-03 DIAGNOSIS — B353 Tinea pedis: Secondary | ICD-10-CM | POA: Insufficient documentation

## 2022-08-03 MED ORDER — CEPHALEXIN 500 MG PO CAPS: 500.0000 mg | ORAL_CAPSULE | Freq: Four times a day (QID) | ORAL | 0 refills | Status: DC

## 2022-08-03 MED ORDER — CLOTRIMAZOLE-BETAMETHASONE 1-0.05 % EX CREA: 1.0000 | TOPICAL_CREAM | Freq: Two times a day (BID) | CUTANEOUS | 0 refills | Status: DC

## 2022-08-03 NOTE — Progress Notes (Signed)
Acute Office Visit  Subjective:    Patient ID: Brett Norman, male    DOB: [birth year], 46 y.o.   MRN: [id number]  Chief Complaint  Patient presents with   Foot Swelling    HPI: Patient is in today for complaints of right foot being slightly swollen.  He had been working over weekend and noted he had blister between 3rd and 4th toes - has mild itchy rash as well around that area and has history of fungal infection in past No history of injury or trauma Past Medical History:  Diagnosis Date   Kidney stone     Past Surgical History:  Procedure Laterality Date   HERNIA REPAIR     age 64   KNEE ARTHROSCOPY     1996, 1998    SHOULDER ARTHROSCOPY W/ ROTATOR CUFF REPAIR Left    TONSILLECTOMY     age 61     Family History  Problem Relation Age of Onset   Breast cancer Mother    Osteoarthritis Father     Social History   Socioeconomic History   Marital status: Legally Separated    Spouse name: Not on file   Number of children: 3   Years of education: Not on file   Highest education level: High school graduate  Occupational History   Not on file  Tobacco Use   Smoking status: Never   Smokeless tobacco: Never  Vaping Use   Vaping Use: Never used  Substance and Sexual Activity   Alcohol use: Never   Drug use: Never   Sexual activity: Yes  Other Topics Concern   Not on file  Social History Narrative   Lives with son and his mother   Right handed   Caffeine: 2-3 sodas a day   Social Determinants of Health   Financial Resource Strain: Not on file  Food Insecurity: Not on file  Transportation Needs: Not on file  Physical Activity: Not on file  Stress: Not on file  Social Connections: Not on file  Intimate Partner Violence: Not on file    Outpatient Medications Prior to Visit  Medication Sig Dispense Refill   ergocalciferol (VITAMIN D2) 1.25 MG (50000 UT) capsule Take 1 capsule (50,000 Units total) by mouth once a week. 12 capsule 1   phentermine 37.5 MG  capsule Take 1 capsule (37.5 mg total) by mouth every morning. 30 capsule 0   No facility-administered medications prior to visit.    Allergies  Allergen Reactions   Pholcodine Hives   Codeine Hives    Review of Systems CONSTITUTIONAL: Negative for chills, fatigue, fever,.  CARDIOVASCULAR: Negative for chest pain, dizziness,  RESPIRATORY: Negative for recent cough and dyspnea.  INTEGUMENTARY:see HPI MUSC - no pain in foot         Objective:  PHYSICAL EXAM:   VS: BP 130/82 (BP Location: Left Arm, Patient Position: Sitting, Cuff Size: Large)   Pulse (!) 106   Temp 97.8 F (36.6 C) (Temporal)   Ht _0  (1.676 m)   Wt 166 lb 6.4 oz (75.5 kg)   SpO2 100%   BMI 26.86 kg/m   GEN: Well nourished, well developed, in no acute distress   Cardiac: RRR; no murmurs,  Respiratory:  normal respiratory rate and pattern with no distress - normal breath sounds with no rales, rhonchi, wheezes or rubs MS: no deformity or atrophy - no tenderness to foot or toes Skin: blister noted between 3rd and 4th toes with mild erythema - also noted  mild fungal rash between toes as well   Health Maintenance Due  Topic Date Due   COLONOSCOPY (Pts 45-60yr Insurance coverage will need to be confirmed)  Never done    There are no preventive care reminders to display for this patient.   Lab Results  Component Value Date   TSH 1.500 12/16/2020   Lab Results  Component Value Date   WBC 7.6 11/28/2021   HGB 17.5 11/28/2021   HCT 49.7 11/28/2021   MCV 89 11/28/2021   PLT 244 11/28/2021   Lab Results  Component Value Date   NA 139 11/28/2021   K 4.1 11/28/2021   CO2 27 11/28/2021   GLUCOSE 90 11/28/2021   BUN 14 11/28/2021   CREATININE 1.22 11/28/2021   BILITOT 0.9 11/28/2021   ALKPHOS 105 11/28/2021   AST 21 11/28/2021   ALT 36 11/28/2021   PROT 6.5 11/28/2021   ALBUMIN 4.6 11/28/2021   CALCIUM 9.5 11/28/2021   EGFR 75 11/28/2021   Lab Results  Component Value Date   CHOL 146  11/28/2021   Lab Results  Component Value Date   HDL 56 11/28/2021   Lab Results  Component Value Date   LDLCALC 75 11/28/2021   Lab Results  Component Value Date   TRIG 77 11/28/2021   Lab Results  Component Value Date   CHOLHDL 2.6 11/28/2021   No results found for: "HGBA1C"     Assessment & Plan:   Problem List Items Addressed This Visit       Musculoskeletal and Integument   Tinea pedis of right foot - Primary - with mild infection noted also   Meds ordered this encounter  Medications   cephALEXin (KEFLEX) 500 MG capsule    Sig: Take 1 capsule (500 mg total) by mouth 4 (four) times daily.    Dispense:  20 capsule    Refill:  0    Order Specific Question:   Supervising Provider    Answer:   CShelton Silvas  clotrimazole-betamethasone (LOTRISONE) cream    Sig: Apply 1 Application topically 2 (two) times daily.    Dispense:  30 g    Refill:  0    Order Specific Question:   Supervising Provider    Answer:   CShelton Silvas   No orders of the defined types were placed in this encounter.    Follow-up: Return if symptoms worsen or fail to improve.  An After Visit Summary was printed and given to the patient.  [facility] [phone]

## 2022-09-09 ENCOUNTER — Other Ambulatory Visit: Payer: Self-pay | Admitting: Physician Assistant

## 2022-09-09 DIAGNOSIS — Z6829 Body mass index (BMI) 29.0-29.9, adult: Secondary | ICD-10-CM

## 2022-09-09 DIAGNOSIS — E663 Overweight: Secondary | ICD-10-CM

## 2022-09-09 MED ORDER — PHENTERMINE HCL 37.5 MG PO CAPS: 37.5000 mg | ORAL_CAPSULE | ORAL | 0 refills | Status: DC

## 2022-09-15 ENCOUNTER — Ambulatory Visit: Payer: BC Managed Care – PPO | Admitting: Nurse Practitioner

## 2022-09-25 ENCOUNTER — Encounter: Payer: Self-pay | Admitting: Nurse Practitioner

## 2022-09-29 ENCOUNTER — Ambulatory Visit: Payer: BC Managed Care – PPO | Admitting: Nurse Practitioner

## 2022-10-05 DIAGNOSIS — E291 Testicular hypofunction: Secondary | ICD-10-CM | POA: Diagnosis not present

## 2022-10-12 DIAGNOSIS — E291 Testicular hypofunction: Secondary | ICD-10-CM | POA: Diagnosis not present

## 2022-10-12 DIAGNOSIS — N401 Enlarged prostate with lower urinary tract symptoms: Secondary | ICD-10-CM | POA: Diagnosis not present

## 2022-10-12 DIAGNOSIS — R3915 Urgency of urination: Secondary | ICD-10-CM | POA: Diagnosis not present

## 2022-10-12 DIAGNOSIS — R3912 Poor urinary stream: Secondary | ICD-10-CM | POA: Diagnosis not present

## 2022-11-03 ENCOUNTER — Encounter: Payer: Self-pay | Admitting: Physician Assistant

## 2022-11-03 ENCOUNTER — Ambulatory Visit: Payer: BC Managed Care – PPO | Admitting: Physician Assistant

## 2022-11-03 VITALS — BP 104/68 | HR 95 | Temp 97.2°F | Ht 65.0 in | Wt 167.2 lb

## 2022-11-03 DIAGNOSIS — J40 Bronchitis, not specified as acute or chronic: Secondary | ICD-10-CM | POA: Diagnosis not present

## 2022-11-03 DIAGNOSIS — J069 Acute upper respiratory infection, unspecified: Secondary | ICD-10-CM

## 2022-11-03 LAB — POCT INFLUENZA A/B
Influenza A, POC: NEGATIVE
Influenza B, POC: NEGATIVE

## 2022-11-03 LAB — POC COVID19 BINAXNOW: SARS Coronavirus 2 Ag: NEGATIVE

## 2022-11-03 MED ORDER — AZITHROMYCIN 250 MG PO TABS: ORAL_TABLET | ORAL | 0 refills | Status: AC

## 2022-11-03 MED ORDER — PROMETHAZINE-DM 6.25-15 MG/5ML PO SYRP: 5.0000 mL | ORAL_SOLUTION | Freq: Four times a day (QID) | ORAL | 0 refills | Status: DC | PRN

## 2022-11-03 NOTE — Progress Notes (Signed)
Acute Office Visit  Subjective:    Patient ID: Brett Norman, male    DOB: 09-13-1975, 47 y.o.   MRN: [id number]  Chief Complaint  Patient presents with   Cough   Nasal Congestion    HPI: Patient is in today for cough,, congestion and nasal drainage for past few days.  Had low grade temp on 'Sunday - cough has been productive.  Son had strep last week  Past Medical History:  Diagnosis Date   Kidney stone     Past Surgical History:  Procedure Laterality Date   HERNIA REPAIR     age 6   KNEE ARTHROSCOPY     19'$ 96, 1998    SHOULDER ARTHROSCOPY W/ ROTATOR CUFF REPAIR Left    TONSILLECTOMY     age 21     Family History  Problem Relation Age of Onset   Breast cancer Mother    Osteoarthritis Father     Social History   Socioeconomic History   Marital status: Legally Separated    Spouse name: Not on file   Number of children: 3   Years of education: Not on file   Highest education level: High school graduate  Occupational History   Not on file  Tobacco Use   Smoking status: Never   Smokeless tobacco: Never  Vaping Use   Vaping Use: Never used  Substance and Sexual Activity   Alcohol use: Never   Drug use: Never   Sexual activity: Yes  Other Topics Concern   Not on file  Social History Narrative   Lives with son and his mother   Right handed   Caffeine: 2-3 sodas a day   Social Determinants of Health   Financial Resource Strain: Not on file  Food Insecurity: Not on file  Transportation Needs: Not on file  Physical Activity: Not on file  Stress: Not on file  Social Connections: Not on file  Intimate Partner Violence: Not on file    Outpatient Medications Prior to Visit  Medication Sig Dispense Refill   ergocalciferol (VITAMIN D2) 1.25 MG (50000 UT) capsule Take 1 capsule (50,000 Units total) by mouth once a week. 12 capsule 1   phentermine 37.5 MG capsule Take 1 capsule (37.5 mg total) by mouth every morning. 30 capsule 0   cephALEXin (KEFLEX) 500 MG  capsule Take 1 capsule (500 mg total) by mouth 4 (four) times daily. 20 capsule 0   clotrimazole-betamethasone (LOTRISONE) cream Apply 1 Application topically 2 (two) times daily. 30 g 0   No facility-administered medications prior to visit.    Allergies  Allergen Reactions   Pholcodine Hives   Codeine Hives    Review of Systems    CONSTITUTIONAL: see HPI E/N/T: see HPI CARDIOVASCULAR: Negative for chest pain,  RESPIRATORY: see HPI      Objective:  PHYSICAL EXAM:   VS: BP 104/68 (BP Location: Left Arm, Patient Position: Sitting, Cuff Size: Large)   Pulse 95   Temp (!) 97.2 F (36.2 C) (Temporal)   Ht '5\' 5"'$  (1.651 m)   Wt 167 lb 3.2 oz (75.8 kg)   SpO2 98%   BMI 27.82 kg/m   GEN: Well nourished, well developed, in no acute distress  HEENT: normal external ears and nose - normal external auditory canals and TMS - - Lips, Teeth and Gums - normal  Oropharynx - mild erythema/pnd Cardiac: RRR; no murmurs,  Respiratory: faint scattered rhonchi noted Skin: warm and dry, no rash   Office Visit on  11/03/2022  Component Date Value Ref Range Status   SARS Coronavirus 2 Ag 11/03/2022 Negative  Negative Final   Influenza A, POC 11/03/2022 Negative  Negative Final   Influenza B, POC 11/03/2022 Negative  Negative Final     Lab Results  Component Value Date   TSH 1.500 12/16/2020   Lab Results  Component Value Date   WBC 7.6 11/28/2021   HGB 17.5 11/28/2021   HCT 49.7 11/28/2021   MCV 89 11/28/2021   PLT 244 11/28/2021   Lab Results  Component Value Date   NA 139 11/28/2021   K 4.1 11/28/2021   CO2 27 11/28/2021   GLUCOSE 90 11/28/2021   BUN 14 11/28/2021   CREATININE 1.22 11/28/2021   BILITOT 0.9 11/28/2021   ALKPHOS 105 11/28/2021   AST 21 11/28/2021   ALT 36 11/28/2021   PROT 6.5 11/28/2021   ALBUMIN 4.6 11/28/2021   CALCIUM 9.5 11/28/2021   EGFR 75 11/28/2021   Lab Results  Component Value Date   CHOL 146 11/28/2021   Lab Results  Component Value  Date   HDL 56 11/28/2021   Lab Results  Component Value Date   LDLCALC 75 11/28/2021   Lab Results  Component Value Date   TRIG 77 11/28/2021   Lab Results  Component Value Date   CHOLHDL 2.6 11/28/2021   No results found for: "HGBA1C"     Assessment & Plan:  Bronchitis  Meds ordered this encounter  Medications   azithromycin (ZITHROMAX) 250 MG tablet    Sig: Take 2 tablets on day 1, then 1 tablet daily on days 2 through 5    Dispense:  6 tablet    Refill:  0    Order Specific Question:   Supervising Provider    Answer:   Rochel Brome [id number]   promethazine-dextromethorphan (PROMETHAZINE-DM) 6.25-15 MG/5ML syrup    Sig: Take 5 mLs by mouth 4 (four) times daily as needed.    Dispense:  118 mL    Refill:  0    Order Specific Question:   Supervising Provider    Answer:   Shelton Silvas    No orders of the defined types were placed in this encounter.    Follow-up: Return if symptoms worsen or fail to improve.  An After Visit Summary was printed and given to the patient.  Yetta Flock [facility] [phone]

## 2022-11-06 ENCOUNTER — Other Ambulatory Visit: Payer: Self-pay | Admitting: Family Medicine

## 2022-11-06 MED ORDER — AMOXICILLIN-POT CLAVULANATE 875-125 MG PO TABS: 1.0000 | ORAL_TABLET | Freq: Two times a day (BID) | ORAL | 0 refills | Status: DC

## 2022-11-23 DIAGNOSIS — N401 Enlarged prostate with lower urinary tract symptoms: Secondary | ICD-10-CM | POA: Diagnosis not present

## 2022-11-23 DIAGNOSIS — R3914 Feeling of incomplete bladder emptying: Secondary | ICD-10-CM | POA: Diagnosis not present

## 2022-11-30 DIAGNOSIS — R3915 Urgency of urination: Secondary | ICD-10-CM | POA: Diagnosis not present

## 2022-11-30 DIAGNOSIS — R3914 Feeling of incomplete bladder emptying: Secondary | ICD-10-CM | POA: Diagnosis not present

## 2022-11-30 DIAGNOSIS — N5201 Erectile dysfunction due to arterial insufficiency: Secondary | ICD-10-CM | POA: Diagnosis not present

## 2022-11-30 DIAGNOSIS — N401 Enlarged prostate with lower urinary tract symptoms: Secondary | ICD-10-CM | POA: Diagnosis not present

## 2023-05-12 ENCOUNTER — Ambulatory Visit: Payer: BC Managed Care – PPO | Admitting: Physician Assistant

## 2023-10-28 ENCOUNTER — Encounter: Payer: Self-pay | Admitting: Physician Assistant

## 2023-10-28 ENCOUNTER — Telehealth: Payer: BC Managed Care – PPO | Admitting: Physician Assistant

## 2023-10-28 VITALS — Ht 65.0 in | Wt 170.0 lb

## 2023-10-28 DIAGNOSIS — J22 Unspecified acute lower respiratory infection: Secondary | ICD-10-CM | POA: Diagnosis not present

## 2023-10-28 MED ORDER — AZITHROMYCIN 250 MG PO TABS: ORAL_TABLET | ORAL | 0 refills | Status: AC

## 2023-10-28 MED ORDER — PREDNISONE 20 MG PO TABS: ORAL_TABLET | ORAL | 0 refills | Status: DC

## 2023-10-28 MED ORDER — PROMETHAZINE-DM 6.25-15 MG/5ML PO SYRP: 5.0000 mL | ORAL_SOLUTION | Freq: Four times a day (QID) | ORAL | 0 refills | Status: DC | PRN

## 2023-10-28 NOTE — Progress Notes (Signed)
Virtual Visit via Telephone Note   This visit type was conducted due to national recommendations for restrictions regarding the COVID-19 Pandemic (e.g. social distancing) in an effort to limit this patient's exposure and mitigate transmission in our community.  Due to his co-morbid illnesses, this patient is at least at moderate risk for complications without adequate follow up.  This format is felt to be most appropriate for this patient at this time.  The patient did not have access to video technology/had technical difficulties with video requiring transitioning to audio format only (telephone).  All issues noted in this document were discussed and addressed.  No physical exam could be performed with this format.  Patient verbally consented to a telehealth visit.   Date:  10/28/2023   ID:  Brett Norman, DOB 1976-06-08, MRN [id number]  Patient Location: Home Provider Location: Office  PCP:  [facility]    Chief Complaint:  persistent cough/congestion  History of Present Illness:    Brett Norman is a 48 y.o. male with complaints that last week started with cough, congestion, body aches and decreased appetite - says he did have a fever last week but not now - says now cough mostly dry with some mild tightness with coughing Has tried dayquil and nyquil  Unsure if exposed to flu or COVID     Past Medical History:  Diagnosis Date   Kidney stone    Past Surgical History:  Procedure Laterality Date   HERNIA REPAIR     age 74   KNEE ARTHROSCOPY     1996, 1998    SHOULDER ARTHROSCOPY W/ ROTATOR CUFF REPAIR Left    TONSILLECTOMY     age 74      Current Meds  Medication Sig   azithromycin (ZITHROMAX) 250 MG tablet Take 2 tablets on day 1, then 1 tablet daily on days 2 through 5   predniSONE (DELTASONE) 20 MG tablet 1 po bid for 5 days   promethazine-dextromethorphan (PROMETHAZINE-DM) 6.25-15 MG/5ML syrup Take 5 mLs by mouth 4 (four) times daily as needed.   [DISCONTINUED]  tadalafil (CIALIS) 5 MG tablet Take 5 mg by mouth daily.     Allergies:   Pholcodine and Codeine   Social History   Tobacco Use   Smoking status: Never   Smokeless tobacco: Never  Vaping Use   Vaping status: Never Used  Substance Use Topics   Alcohol use: Never   Drug use: Never     Family Hx: The patient's family history includes Breast cancer in his mother; Osteoarthritis in his father.  ROS:   Please see the history of present illness.    All other systems reviewed and are negative.  Labs/Other Tests and Data Reviewed:    Recent Labs: No results found for requested labs within last 365 days.   Recent Lipid Panel Lab Results  Component Value Date/Time   CHOL 146 11/28/2021 08:11 AM   TRIG 77 11/28/2021 08:11 AM   HDL 56 11/28/2021 08:11 AM   CHOLHDL 2.6 11/28/2021 08:11 AM   LDLCALC 75 11/28/2021 08:11 AM    Wt Readings from Last 3 Encounters:  10/28/23 170 lb (77.1 kg)  11/03/22 167 lb 3.2 oz (75.8 kg)  08/03/22 166 lb 6.4 oz (75.5 kg)     Objective:    Vital Signs:  Ht 5\' 5"  (1.651 m)   Wt 170 lb (77.1 kg)   BMI 28.29 kg/m    VITAL SIGNS:  reviewed GEN:  no acute distress  ASSESSMENT & PLAN:    Lower respiratory infection --- rx for zpack and prednisone to take as directed -- also rx for Phenergan DM to take as needed for cough  COVID-19 Education: The signs and symptoms of COVID-19 were discussed with the patient and how to seek care for testing (follow up with PCP or arrange E-visit). The importance of social distancing was discussed today.  Time:   Today, I have spent 10 minutes with the patient with telehealth technology discussing the above problems.     Medication Adjustments/Labs and Tests Ordered: Current medicines are reviewed at length with the patient today.  Concerns regarding medicines are outlined above.   Tests Ordered: No orders of the defined types were placed in this encounter.   Medication Changes: Meds ordered this  encounter  Medications   azithromycin (ZITHROMAX) 250 MG tablet    Sig: Take 2 tablets on day 1, then 1 tablet daily on days 2 through 5    Dispense:  6 tablet    Refill:  0    Supervising Provider:   [facility], Fritzi Mandes [[id number]]   predniSONE (DELTASONE) 20 MG tablet    Sig: 1 po bid for 5 days    Dispense:  10 tablet    Refill:  0    Supervising Provider:   Corey Harold   promethazine-dextromethorphan (PROMETHAZINE-DM) 6.25-15 MG/5ML syrup    Sig: Take 5 mLs by mouth 4 (four) times daily as needed.    Dispense:  118 mL    Refill:  0    Supervising Provider:   Blane Ohara [id number]    Follow Up:  In Person prn  Signed, Vickey Sages, PA-C  10/28/2023 2:04 PM    [facility]

## 2023-11-29 ENCOUNTER — Ambulatory Visit: Admitting: Physician Assistant

## 2023-11-29 ENCOUNTER — Encounter: Payer: Self-pay | Admitting: Physician Assistant

## 2023-11-29 VITALS — BP 102/70 | HR 97 | Temp 98.0°F | Resp 18 | Ht 65.0 in | Wt 171.4 lb

## 2023-11-29 DIAGNOSIS — J22 Unspecified acute lower respiratory infection: Secondary | ICD-10-CM | POA: Diagnosis not present

## 2023-11-29 LAB — POCT INFLUENZA A/B
Influenza A, POC: NEGATIVE
Influenza B, POC: NEGATIVE

## 2023-11-29 LAB — POC COVID19 BINAXNOW: SARS Coronavirus 2 Ag: NEGATIVE

## 2023-11-29 MED ORDER — DOXYCYCLINE HYCLATE 100 MG PO TABS: 100.0000 mg | ORAL_TABLET | Freq: Two times a day (BID) | ORAL | 0 refills | Status: DC

## 2023-11-29 MED ORDER — MONTELUKAST SODIUM 10 MG PO TABS: 10.0000 mg | ORAL_TABLET | Freq: Every day | ORAL | 3 refills | Status: DC

## 2023-11-29 MED ORDER — PREDNISONE 20 MG PO TABS: ORAL_TABLET | ORAL | 0 refills | Status: DC

## 2023-11-29 NOTE — Progress Notes (Signed)
   Acute Office Visit  Subjective:    Patient ID: Brett Norman, male    DOB: 1976/08/14, 48 y.o.   MRN: [id number]  Chief Complaint  Patient presents with   Cough   Nasal Congestion    HPI: Patient is in today for complaints of productive cough that started on Saturday.  Denies fever or malaise but now started with head congestion and sinus pressure also.  He has taken Norel at home which is helpful Pt had similar symptoms about a month ago which he says did totally clear   Current Outpatient Medications:    doxycycline (VIBRA-TABS) 100 MG tablet, Take 1 tablet (100 mg total) by mouth 2 (two) times daily., Disp: 20 tablet, Rfl: 0   montelukast (SINGULAIR) 10 MG tablet, Take 1 tablet (10 mg total) by mouth at bedtime., Disp: 30 tablet, Rfl: 3   predniSONE (DELTASONE) 20 MG tablet, 1 po tid for 3 days then 1 po bid for 3 days then 1 po qd for 3 days, Disp: 18 tablet, Rfl: 0  Allergies  Allergen Reactions   Pholcodine Hives   Codeine Hives    ROS CONSTITUTIONAL: Negative for chills, fatigue, fever,  E/N/T: see HPI CARDIOVASCULAR: Negative for chest pain, dizziness, RESPIRATORY: see HPI      Objective:    PHYSICAL EXAM:   BP 102/70 (BP Location: Right Arm, Patient Position: Sitting, Cuff Size: Normal)   Pulse 97   Temp 98 F (36.7 C) (Temporal)   Resp 18   Ht 5\' 5"  (1.651 m)   Wt 171 lb 6.4 oz (77.7 kg)   SpO2 97%   BMI 28.52 kg/m    GEN: Well nourished, well developed, in no acute distress  HEENT: normal external ears and nose - normal external auditory canals and TMS - hearing grossly normal - Oropharynx - mild erythema  Cardiac: RRR; no murmurs, Respiratory:  scattered rhonchi noted  Office Visit on 11/29/2023  Component Date Value Ref Range Status   Influenza A, POC 11/29/2023 Negative  Negative Final   Influenza B, POC 11/29/2023 Negative  Negative Final   SARS Coronavirus 2 Ag 11/29/2023 Negative  Negative Final        Assessment & Plan:    Lower  respiratory infection -     Doxycycline Hyclate; Take 1 tablet (100 mg total) by mouth 2 (two) times daily.  Dispense: 20 tablet; Refill: 0 -     predniSONE; 1 po tid for 3 days then 1 po bid for 3 days then 1 po qd for 3 days  Dispense: 18 tablet; Refill: 0 -     Montelukast Sodium; Take 1 tablet (10 mg total) by mouth at bedtime.  Dispense: 30 tablet; Refill: 3 -     POCT Influenza A/B -     POC COVID-19 BinaxNow   Follow-up: Return if symptoms worsen or fail to improve.- if cough persists more than 2 weeks pt to notify and will order chest xray  An After Visit Summary was printed and given to the patient.  Jettie Pagan [facility] [phone]

## 2023-12-02 ENCOUNTER — Other Ambulatory Visit: Payer: Self-pay | Admitting: Physician Assistant

## 2023-12-02 ENCOUNTER — Other Ambulatory Visit: Payer: Self-pay

## 2023-12-02 ENCOUNTER — Encounter: Payer: Self-pay | Admitting: Physician Assistant

## 2023-12-02 ENCOUNTER — Ambulatory Visit (INDEPENDENT_AMBULATORY_CARE_PROVIDER_SITE_OTHER)
Admission: RE | Admit: 2023-12-02 | Discharge: 2023-12-02 | Disposition: A | Discharge status: Home / Self Care | Source: Ambulatory Visit | Attending: Physician Assistant | Admitting: Physician Assistant

## 2023-12-02 ENCOUNTER — Telehealth: Payer: Self-pay

## 2023-12-02 DIAGNOSIS — R059 Cough, unspecified: Secondary | ICD-10-CM | POA: Diagnosis not present

## 2023-12-02 DIAGNOSIS — J40 Bronchitis, not specified as acute or chronic: Secondary | ICD-10-CM | POA: Diagnosis not present

## 2023-12-02 DIAGNOSIS — R051 Acute cough: Secondary | ICD-10-CM

## 2023-12-02 MED ORDER — ALBUTEROL SULFATE HFA 108 (90 BASE) MCG/ACT IN AERS: 2.0000 | INHALATION_SPRAY | Freq: Four times a day (QID) | RESPIRATORY_TRACT | 2 refills | Status: AC | PRN

## 2023-12-02 NOTE — Telephone Encounter (Signed)
 Chest xray order given - if patient's symptoms have worsened recommend to come in for follow up visit also

## 2023-12-02 NOTE — Telephone Encounter (Signed)
 Patient spouse called and stated that patient is concern that he may need chest x-ray. Patient is complaining of chest heaviest, lung hurts, SOB,  Cough is worst. Please advise

## 2023-12-29 DIAGNOSIS — E291 Testicular hypofunction: Secondary | ICD-10-CM | POA: Diagnosis not present

## 2024-01-05 DIAGNOSIS — R3915 Urgency of urination: Secondary | ICD-10-CM | POA: Diagnosis not present

## 2024-01-05 DIAGNOSIS — N401 Enlarged prostate with lower urinary tract symptoms: Secondary | ICD-10-CM | POA: Diagnosis not present

## 2024-01-05 DIAGNOSIS — N5201 Erectile dysfunction due to arterial insufficiency: Secondary | ICD-10-CM | POA: Diagnosis not present

## 2024-01-05 DIAGNOSIS — R3914 Feeling of incomplete bladder emptying: Secondary | ICD-10-CM | POA: Diagnosis not present

## 2024-02-24 ENCOUNTER — Other Ambulatory Visit: Payer: Self-pay | Admitting: Physician Assistant

## 2024-02-24 DIAGNOSIS — J22 Unspecified acute lower respiratory infection: Secondary | ICD-10-CM

## 2024-05-15 NOTE — Progress Notes (Unsigned)
 Subjective:  Patient ID: Brett Norman, male    DOB: December 10, 1975  Age: 48 y.o. MRN: [id number]  No chief complaint on file.  HPI:  Well Adult Physical: Patient here for a comprehensive physical exam.The patient reports {problems:16946} Do you take any herbs or supplements that were not prescribed by a doctor? {yes/no/not asked:9010} Are you taking calcium supplements? {yes/no:63} Are you taking aspirin daily? {yes/no:63}  Encounter for general adult medical examination without abnormal findings  Physical (At Risk items are starred): Patient's last physical exam was 1 year ago .  Patient wears a seat belt, has smoke detectors, has carbon monoxide detectors, practices appropriate gun safety, and wears sunscreen with extended sun exposure. Dental Care: biannual cleanings, brushes and flosses daily. Ophthalmology/Optometry: Annual visit.  Hearing loss: none Vision impairments: none Last PSA:     11/29/2023    2:43 PM 06/08/2022    7:42 AM 04/21/2021    8:41 AM 04/21/2021    8:29 AM  Depression screen PHQ 2/9  Decreased Interest 0 0 3 0  Down, Depressed, Hopeless 0 0 0 0  PHQ - 2 Score 0 0 3 0  Altered sleeping  0 2   Tired, decreased energy  0 2   Change in appetite  0 0   Feeling bad or failure about yourself   0 0   Trouble concentrating  0 0   Moving slowly or fidgety/restless  0 0   Suicidal thoughts  0 0   PHQ-9 Score  0 7   Difficult doing work/chores  Not difficult at all Somewhat difficult          04/21/2021    8:20 AM 03/04/2022    7:38 AM 11/29/2023    2:43 PM  Fall Risk  Falls in the past year? 0 0 0  Was there an injury with Fall? 0 0 0  Fall Risk Category Calculator 0 0 0  Fall Risk Category (Retired) Low  Low    (RETIRED) Patient Fall Risk Level Low fall risk  Low fall risk    Patient at Risk for Falls Due to  No Fall Risks No Fall Risks  Fall risk Follow up Falls evaluation completed  Falls evaluation completed  Falls evaluation completed     Data saved  with a previous flowsheet row definition              Past Medical History:  Diagnosis Date   Kidney stone    Past Surgical History:  Procedure Laterality Date   HERNIA REPAIR     age 19   KNEE ARTHROSCOPY     1996, 1998    SHOULDER ARTHROSCOPY W/ ROTATOR CUFF REPAIR Left    TONSILLECTOMY     age 14     Family History  Problem Relation Age of Onset   Breast cancer Mother    Osteoarthritis Father    Social History   Socioeconomic History   Marital status: Legally Separated    Spouse name: Not on file   Number of children: 3   Years of education: Not on file   Highest education level: High school graduate  Occupational History   Not on file  Tobacco Use   Smoking status: Never   Smokeless tobacco: Never  Vaping Use   Vaping status: Never Used  Substance and Sexual Activity   Alcohol use: Never   Drug use: Never   Sexual activity: Yes  Other Topics Concern   Not on file  Social History  Narrative   Lives with son and his mother   Right handed   Caffeine: 2-3 sodas a day   Social Drivers of Corporate investment banker Strain: Not on file  Food Insecurity: Not on file  Transportation Needs: Not on file  Physical Activity: Not on file  Stress: Not on file  Social Connections: Not on file   Review of Systems  Constitutional:  Negative for appetite change, fatigue and fever.  HENT:  Negative for congestion, ear pain, sinus pressure and sore throat.   Respiratory:  Negative for cough, chest tightness, shortness of breath and wheezing.   Cardiovascular:  Negative for chest pain and palpitations.  Gastrointestinal:  Negative for abdominal pain, constipation, diarrhea, nausea and vomiting.  Genitourinary:  Negative for dysuria and hematuria.  Musculoskeletal:  Negative for arthralgias, back pain, joint swelling and myalgias.  Skin:  Negative for rash.  Neurological:  Negative for dizziness, weakness and headaches.  Psychiatric/Behavioral:  Negative for  dysphoric mood. The patient is not nervous/anxious.      Objective:  There were no vitals taken for this visit.     11/29/2023    2:42 PM 10/28/2023    1:19 PM 11/03/2022    3:50 PM  BP/Weight  Systolic BP 102  895  Diastolic BP 70  68  Wt. (Lbs) 171.4 170 167.2  BMI 28.52 kg/m2 28.29 kg/m2 27.82 kg/m2    Physical Exam  Lab Results  Component Value Date   WBC 7.6 11/28/2021   HGB 17.5 11/28/2021   HCT 49.7 11/28/2021   PLT 244 11/28/2021   GLUCOSE 90 11/28/2021   CHOL 146 11/28/2021   TRIG 77 11/28/2021   HDL 56 11/28/2021   LDLCALC 75 11/28/2021   ALT 36 11/28/2021   AST 21 11/28/2021   NA 139 11/28/2021   K 4.1 11/28/2021   CL 99 11/28/2021   CREATININE 1.22 11/28/2021   BUN 14 11/28/2021   CO2 27 11/28/2021   TSH 1.500 12/16/2020      Assessment & Plan:  There are no diagnoses linked to this encounter.   There is no height or weight on file to calculate BMI.   These are the goals we discussed:  Goals   None      This is a list of the screening recommended for you and due dates:  Health Maintenance  Topic Date Due   Hepatitis B Vaccine (1 of 3 - 19+ 3-dose series) Never done   DTaP/Tdap/Td vaccine (2 - Td or Tdap) 09/07/2022   Flu Shot  Never done   Colon Cancer Screening  05/23/2031   Hepatitis C Screening  Completed   Pneumococcal Vaccine  Aged Out   HPV Vaccine  Aged Out   Meningitis B Vaccine  Aged Out   COVID-19 Vaccine  Discontinued   HIV Screening  Discontinued     No orders of the defined types were placed in this encounter.    Follow-up: No follow-ups on file.  An After Visit Summary was printed and given to the patient.    I,Lauren M Auman,acting as a Neurosurgeon for [employer], PA.,have documented all relevant documentation on the behalf of Nola Angles, PA,as directed by  Nola Angles, PA while in the presence of Nola Angles, [place].    Nola Angles, [place] [facility] [phone]

## 2024-05-16 ENCOUNTER — Ambulatory Visit (INDEPENDENT_AMBULATORY_CARE_PROVIDER_SITE_OTHER): Admitting: Physician Assistant

## 2024-05-16 ENCOUNTER — Encounter: Payer: Self-pay | Admitting: Physician Assistant

## 2024-05-16 VITALS — BP 122/82 | HR 83 | Temp 97.3°F | Ht 65.0 in | Wt 161.0 lb

## 2024-05-16 DIAGNOSIS — Z0000 Encounter for general adult medical examination without abnormal findings: Secondary | ICD-10-CM | POA: Insufficient documentation

## 2024-05-16 DIAGNOSIS — R634 Abnormal weight loss: Secondary | ICD-10-CM | POA: Insufficient documentation

## 2024-05-16 NOTE — Assessment & Plan Note (Signed)
 Routine wellness visit with no abnormalities detected. - Schedule annual physical examination for next year unless lab results indicate need for earlier follow-up.

## 2024-05-16 NOTE — Assessment & Plan Note (Signed)
 Unintentional 10-pound weight loss over four months with stress and decreased appetite. Differential includes stress-related weight loss and thyroid dysfunction. - Order CBC, kidney and liver function tests, A1c, cholesterol, thyroid function, and PSA. - Consider stress management options including medications and counseling if labs are normal. - Perform urinalysis to rule out hematuria.

## 2024-05-16 NOTE — Progress Notes (Signed)
 Subjective:  Patient ID: Brett Norman, male    DOB: 1976-02-05  Age: 48 y.o. MRN: [id number]  Chief Complaint  Patient presents with   Annual Physical    HPI: Discussed the use of AI scribe software for clinical note transcription with the patient, who gave verbal consent to proceed.  Discussed the use of AI scribe software for clinical note transcription with the patient, who gave verbal consent to proceed.  History of Present Illness   Charels Norman is a 48 year old male who presents with unintentional weight loss and loss of appetite.  He has experienced unintentional weight loss of approximately ten pounds over the past four months, which he attributes to stress. He has been experiencing increased stress since May. Over the last month, he has noticed a decrease in appetite, eating only one to two meals per day. His meals typically consist of eggs, sausage, and chicken prepared in a crockpot, with occasional protein bars during work. No nausea, vomiting, fever, or recent blood in the stool.  He has a history of colon polyps, with his last colonoscopy in 2022 reported as normal and recommended for follow-up in seven years. He also has a history of kidney stones, with the last occurrence a couple of years ago, but denies any current pain or difficulty with urination.  He has no history of anxiety or depression and denies any recent alcohol or tobacco use. He previously experienced difficulty urinating and was prescribed Cialis, which he has not taken this year as he no longer experiences these symptoms. He last saw a urologist in March 2025, where blood work was performed, and no abnormalities were reported.         05/16/2024    8:46 AM 11/29/2023    2:43 PM 06/08/2022    7:42 AM 04/21/2021    8:41 AM 04/21/2021    8:29 AM  Depression screen PHQ 2/9  Decreased Interest 0 0 0 3 0  Down, Depressed, Hopeless 0 0 0 0 0  PHQ - 2 Score 0 0 0 3 0  Altered sleeping 0  0 2   Tired, decreased  energy 0  0 2   Change in appetite 2  0 0   Feeling bad or failure about yourself  0  0 0   Trouble concentrating 0  0 0   Moving slowly or fidgety/restless 0  0 0   Suicidal thoughts 0  0 0   PHQ-9 Score 2  0 7   Difficult doing work/chores Not difficult at all  Not difficult at all Somewhat difficult         05/16/2024    8:56 AM  Fall Risk   Falls in the past year? 0  Number falls in past yr: 0  Injury with Fall? 0  Risk for fall due to : No Fall Risks  Follow up Falls evaluation completed    Patient Care Team: Practice, [facility] as PCP - General Ladora, Tri H., MD as Referring Physician (Gastroenterology)   Review of Systems  Performed and documented in Annual physical  Current Outpatient Medications on File Prior to Visit  Medication Sig Dispense Refill   albuterol  (VENTOLIN  HFA) 108 (90 Base) MCG/ACT inhaler Inhale 2 puffs into the lungs every 6 (six) hours as needed for wheezing or shortness of breath. 8 g 2   montelukast  (SINGULAIR ) 10 MG tablet TAKE 1 TABLET BY MOUTH EVERYDAY AT BEDTIME (Patient not taking: Reported on 05/16/2024) 90 tablet 0  No current facility-administered medications on file prior to visit.   Past Medical History:  Diagnosis Date   Kidney stone    Past Surgical History:  Procedure Laterality Date   HERNIA REPAIR     age 48   KNEE ARTHROSCOPY     1996, 1998    SHOULDER ARTHROSCOPY W/ ROTATOR CUFF REPAIR Left    TONSILLECTOMY     age 67     Family History  Problem Relation Age of Onset   Breast cancer Mother    Osteoarthritis Father    Social History   Socioeconomic History   Marital status: Legally Separated    Spouse name: Not on file   Number of children: 3   Years of education: Not on file   Highest education level: High school graduate  Occupational History   Not on file  Tobacco Use   Smoking status: Never   Smokeless tobacco: Never  Vaping Use   Vaping status: Never Used  Substance and Sexual Activity   Alcohol use:  Never   Drug use: Never   Sexual activity: Yes  Other Topics Concern   Not on file  Social History Narrative   Lives with son and his mother   Right handed   Caffeine: 2-3 sodas a day   Social Drivers of Corporate investment banker Strain: Not on file  Food Insecurity: Not on file  Transportation Needs: Not on file  Physical Activity: Not on file  Stress: Not on file  Social Connections: Not on file    Objective:  BP 122/82 (BP Location: Left Arm, Patient Position: Sitting)   Pulse 83   Temp (!) 97.3 F (36.3 C) (Temporal)   Ht 5' 5 (1.651 m)   Wt 161 lb (73 kg)   SpO2 99%   BMI 26.79 kg/m      05/16/2024    8:49 AM 11/29/2023    2:42 PM 10/28/2023    1:19 PM  BP/Weight  Systolic BP 122 102   Diastolic BP 82 70   Wt. (Lbs) 161 171.4 170  BMI 26.79 kg/m2 28.52 kg/m2 28.29 kg/m2    Physical Exam  Performed and documented in Annual Physical      Lab Results  Component Value Date   WBC 7.6 11/28/2021   HGB 17.5 11/28/2021   HCT 49.7 11/28/2021   PLT 244 11/28/2021   GLUCOSE 90 11/28/2021   CHOL 146 11/28/2021   TRIG 77 11/28/2021   HDL 56 11/28/2021   LDLCALC 75 11/28/2021   ALT 36 11/28/2021   AST 21 11/28/2021   NA 139 11/28/2021   K 4.1 11/28/2021   CL 99 11/28/2021   CREATININE 1.22 11/28/2021   BUN 14 11/28/2021   CO2 27 11/28/2021   TSH 1.500 12/16/2020      Assessment & Plan:  Encounter for annual health examination Assessment & Plan: Routine wellness visit with no abnormalities detected. - Schedule annual physical examination for next year unless lab results indicate need for earlier follow-up.  Orders: -     CBC with Differential/Platelet -     Comprehensive metabolic panel with GFR -     Hemoglobin A1c -     Lipid panel  Abnormal weight loss Assessment & Plan: Unintentional 10-pound weight loss over four months with stress and decreased appetite. Differential includes stress-related weight loss and thyroid dysfunction. -  Order CBC, kidney and liver function tests, A1c, cholesterol, thyroid function, and PSA. - Consider stress management options including  medications and counseling if labs are normal. - Perform urinalysis to rule out hematuria.  Orders: -     T4, free -     TSH -     PSA    No orders of the defined types were placed in this encounter.   Orders Placed This Encounter  Procedures   CBC with Differential/Platelet   Comprehensive metabolic panel with GFR   Hemoglobin A1c   Lipid panel   T4, free   TSH   PSA     Follow-up: Return in about 1 year (around 05/16/2025) for cpe.   I,Lasonja Lakins,acting as a Neurosurgeon for [employer].,have documented all relevant documentation on the behalf of Nola Angles, PA,as directed by  Nola Angles, PA while in the presence of Nola Angles, [place].   An After Visit Summary was printed and given to the patient.  Nola Angles, [place] [facility] [phone]

## 2024-05-17 ENCOUNTER — Ambulatory Visit: Payer: Self-pay | Admitting: Family Medicine

## 2024-05-17 LAB — CBC WITH DIFFERENTIAL/PLATELET
Basophils Absolute: 0 x10E3/uL (ref 0.0–0.2)
Basos: 0 %
EOS (ABSOLUTE): 0.1 x10E3/uL (ref 0.0–0.4)
Eos: 1 %
Hematocrit: 52.1 % — ABNORMAL HIGH (ref 37.5–51.0)
Hemoglobin: 16.7 g/dL (ref 13.0–17.7)
Immature Grans (Abs): 0 x10E3/uL (ref 0.0–0.1)
Immature Granulocytes: 0 %
Lymphocytes Absolute: 1.6 x10E3/uL (ref 0.7–3.1)
Lymphs: 24 %
MCH: 29.8 pg (ref 26.6–33.0)
MCHC: 32.1 g/dL (ref 31.5–35.7)
MCV: 93 fL (ref 79–97)
Monocytes Absolute: 0.4 x10E3/uL (ref 0.1–0.9)
Monocytes: 6 %
Neutrophils Absolute: 4.7 x10E3/uL (ref 1.4–7.0)
Neutrophils: 69 %
Platelets: 180 x10E3/uL (ref 150–450)
RBC: 5.6 x10E6/uL (ref 4.14–5.80)
RDW: 12.3 % (ref 11.6–15.4)
WBC: 6.9 x10E3/uL (ref 3.4–10.8)

## 2024-05-17 LAB — COMPREHENSIVE METABOLIC PANEL WITH GFR
ALT: 40 IU/L (ref 0–44)
AST: 31 IU/L (ref 0–40)
Albumin: 4.6 g/dL (ref 4.1–5.1)
Alkaline Phosphatase: 93 IU/L (ref 44–121)
BUN/Creatinine Ratio: 19 (ref 9–20)
BUN: 19 mg/dL (ref 6–24)
Bilirubin Total: 0.7 mg/dL (ref 0.0–1.2)
CO2: 25 mmol/L (ref 20–29)
Calcium: 9.2 mg/dL (ref 8.7–10.2)
Chloride: 102 mmol/L (ref 96–106)
Creatinine, Ser: 1.01 mg/dL (ref 0.76–1.27)
Globulin, Total: 1.9 g/dL (ref 1.5–4.5)
Glucose: 94 mg/dL (ref 70–99)
Potassium: 4.6 mmol/L (ref 3.5–5.2)
Sodium: 140 mmol/L (ref 134–144)
Total Protein: 6.5 g/dL (ref 6.0–8.5)
eGFR: 93 mL/min/1.73 (ref >59)

## 2024-05-17 LAB — LIPID PANEL
Chol/HDL Ratio: 2.6 ratio (ref 0.0–5.0)
Cholesterol, Total: 138 mg/dL (ref 100–199)
HDL: 53 mg/dL (ref >39)
LDL Chol Calc (NIH): 66 mg/dL (ref 0–99)
Triglycerides: 105 mg/dL (ref 0–149)
VLDL Cholesterol Cal: 19 mg/dL (ref 5–40)

## 2024-05-17 LAB — HEMOGLOBIN A1C
Est. average glucose Bld gHb Est-mCnc: 114 mg/dL
Hgb A1c MFr Bld: 5.6 % (ref 4.8–5.6)

## 2024-05-17 LAB — T4, FREE: Free T4: 1.43 ng/dL (ref 0.82–1.77)

## 2024-05-17 LAB — PSA: Prostate Specific Ag, Serum: 0.7 ng/mL (ref 0.0–4.0)

## 2024-05-17 LAB — TSH: TSH: 2.04 u[IU]/mL (ref 0.450–4.500)
